# Patient Record
Sex: Female | Born: 1960
Health system: Southern US, Community
[De-identification: ages and names within clinical notes are randomized; demographics above are authoritative.]

## PROBLEM LIST (undated history)

## (undated) ENCOUNTER — Ambulatory Visit (HOSPITAL_COMMUNITY): Disposition: A | Discharge status: Home / Self Care | Payer: Self-pay

## (undated) DIAGNOSIS — U071 COVID-19: Secondary | ICD-10-CM

## (undated) DIAGNOSIS — F419 Anxiety disorder, unspecified: Secondary | ICD-10-CM

## (undated) DIAGNOSIS — M67439 Ganglion, unspecified wrist: Secondary | ICD-10-CM

## (undated) DIAGNOSIS — E785 Hyperlipidemia, unspecified: Secondary | ICD-10-CM

## (undated) DIAGNOSIS — F329 Major depressive disorder, single episode, unspecified: Secondary | ICD-10-CM

## (undated) DIAGNOSIS — F32A Depression, unspecified: Secondary | ICD-10-CM

## (undated) HISTORY — DX: COVID-19: U07.1

---

## 1997-09-15 ENCOUNTER — Other Ambulatory Visit: Admission: RE | Admit: 1997-09-15 | Discharge: 1997-09-15 | Payer: Self-pay | Admitting: Obstetrics and Gynecology

## 1997-11-19 ENCOUNTER — Other Ambulatory Visit: Admission: RE | Admit: 1997-11-19 | Discharge: 1997-11-19 | Payer: Self-pay | Admitting: Obstetrics and Gynecology

## 1998-03-26 ENCOUNTER — Other Ambulatory Visit: Admission: RE | Admit: 1998-03-26 | Discharge: 1998-03-26 | Payer: Self-pay | Admitting: *Deleted

## 1998-04-27 ENCOUNTER — Other Ambulatory Visit: Admission: RE | Admit: 1998-04-27 | Discharge: 1998-04-27 | Payer: Self-pay | Admitting: Obstetrics and Gynecology

## 1998-09-16 ENCOUNTER — Other Ambulatory Visit: Admission: RE | Admit: 1998-09-16 | Discharge: 1998-09-16 | Payer: Self-pay | Admitting: Obstetrics and Gynecology

## 1999-09-06 ENCOUNTER — Other Ambulatory Visit: Admission: RE | Admit: 1999-09-06 | Discharge: 1999-09-06 | Payer: Self-pay | Admitting: Obstetrics and Gynecology

## 1999-10-05 ENCOUNTER — Other Ambulatory Visit: Admission: RE | Admit: 1999-10-05 | Discharge: 1999-10-05 | Payer: Self-pay | Admitting: Obstetrics and Gynecology

## 2000-01-30 ENCOUNTER — Other Ambulatory Visit: Admission: RE | Admit: 2000-01-30 | Discharge: 2000-01-30 | Payer: Self-pay | Admitting: Obstetrics and Gynecology

## 2000-02-09 ENCOUNTER — Ambulatory Visit (HOSPITAL_COMMUNITY): Admission: RE | Admit: 2000-02-09 | Discharge: 2000-02-09 | Payer: Self-pay | Admitting: Obstetrics and Gynecology

## 2000-02-09 ENCOUNTER — Encounter: Payer: Self-pay | Admitting: Obstetrics and Gynecology

## 2000-02-17 ENCOUNTER — Encounter (INDEPENDENT_AMBULATORY_CARE_PROVIDER_SITE_OTHER): Payer: Self-pay

## 2000-02-17 ENCOUNTER — Other Ambulatory Visit: Admission: RE | Admit: 2000-02-17 | Discharge: 2000-02-17 | Payer: Self-pay | Admitting: Obstetrics and Gynecology

## 2000-02-17 ENCOUNTER — Ambulatory Visit (HOSPITAL_COMMUNITY): Admission: RE | Admit: 2000-02-17 | Discharge: 2000-02-17 | Payer: Self-pay | Admitting: Obstetrics and Gynecology

## 2000-03-23 ENCOUNTER — Other Ambulatory Visit: Admission: RE | Admit: 2000-03-23 | Discharge: 2000-03-23 | Payer: Self-pay | Admitting: Obstetrics and Gynecology

## 2000-06-21 ENCOUNTER — Other Ambulatory Visit: Admission: RE | Admit: 2000-06-21 | Discharge: 2000-06-21 | Payer: Self-pay | Admitting: Obstetrics and Gynecology

## 2000-11-19 ENCOUNTER — Other Ambulatory Visit: Admission: RE | Admit: 2000-11-19 | Discharge: 2000-11-19 | Payer: Self-pay | Admitting: Obstetrics and Gynecology

## 2000-11-26 ENCOUNTER — Encounter: Admission: RE | Admit: 2000-11-26 | Discharge: 2001-02-24 | Payer: Self-pay | Admitting: Obstetrics and Gynecology

## 2001-02-07 ENCOUNTER — Inpatient Hospital Stay (HOSPITAL_COMMUNITY): Admission: AD | Admit: 2001-02-07 | Discharge: 2001-02-11 | Payer: Self-pay | Admitting: Obstetrics and Gynecology

## 2001-02-07 ENCOUNTER — Encounter (INDEPENDENT_AMBULATORY_CARE_PROVIDER_SITE_OTHER): Payer: Self-pay | Admitting: Specialist

## 2001-03-14 ENCOUNTER — Other Ambulatory Visit: Admission: RE | Admit: 2001-03-14 | Discharge: 2001-03-14 | Payer: Self-pay | Admitting: Obstetrics and Gynecology

## 2003-03-05 ENCOUNTER — Other Ambulatory Visit: Admission: RE | Admit: 2003-03-05 | Discharge: 2003-03-05 | Payer: Self-pay | Admitting: Obstetrics and Gynecology

## 2003-09-16 ENCOUNTER — Other Ambulatory Visit: Admission: RE | Admit: 2003-09-16 | Discharge: 2003-09-16 | Payer: Self-pay | Admitting: Obstetrics and Gynecology

## 2003-09-24 ENCOUNTER — Ambulatory Visit (HOSPITAL_COMMUNITY): Admission: RE | Admit: 2003-09-24 | Discharge: 2003-09-24 | Payer: Self-pay | Admitting: Obstetrics and Gynecology

## 2004-03-28 ENCOUNTER — Other Ambulatory Visit: Admission: RE | Admit: 2004-03-28 | Discharge: 2004-03-28 | Payer: Self-pay | Admitting: Obstetrics and Gynecology

## 2004-03-29 ENCOUNTER — Other Ambulatory Visit: Admission: RE | Admit: 2004-03-29 | Discharge: 2004-03-29 | Payer: Self-pay | Admitting: Obstetrics and Gynecology

## 2004-08-31 ENCOUNTER — Other Ambulatory Visit: Admission: RE | Admit: 2004-08-31 | Discharge: 2004-08-31 | Payer: Self-pay | Admitting: Obstetrics and Gynecology

## 2005-02-21 ENCOUNTER — Other Ambulatory Visit: Admission: RE | Admit: 2005-02-21 | Discharge: 2005-02-21 | Payer: Self-pay | Admitting: Obstetrics and Gynecology

## 2006-10-02 ENCOUNTER — Encounter: Admission: RE | Admit: 2006-10-02 | Discharge: 2006-10-02 | Payer: Self-pay | Admitting: Family Medicine

## 2007-12-20 ENCOUNTER — Encounter: Admission: RE | Admit: 2007-12-20 | Discharge: 2007-12-20 | Payer: Self-pay | Admitting: Family Medicine

## 2008-09-22 ENCOUNTER — Encounter: Admission: RE | Admit: 2008-09-22 | Discharge: 2008-09-22 | Payer: Self-pay | Admitting: Family Medicine

## 2010-06-07 ENCOUNTER — Other Ambulatory Visit: Payer: Self-pay | Admitting: Obstetrics and Gynecology

## 2010-07-29 NOTE — Discharge Summary (Signed)
[facility]  Patient:    Kimberly David, Kimberly David Visit Number: [id number] MRN: [id number]          Service Type: OBS Location: 910A 9132 01 Attending Physician:  Miguel Aschoff Dictated by:   Caralyn Guile. Arlyce Dice, M.D. Admit Date:  02/07/2001 Discharge Date: 02/11/2001                             Discharge Summary  FINAL DIAGNOSES:              1. Term pregnancy, delivered living well                                  female infant.                               2. Chorioamnionitis.                               3. Nonreassuring fetal heart rate tracing.  SECONDARY DIAGNOSES:          None.  PROCEDURE:                    Primary low transverse cesarean section.  COMPLICATIONS:                None.  CONDITION ON DISCHARGE:       Improved.  HISTORY:                      This is a 50 year old gravida 4, para 2, AB 1, at 39-1/[redacted] weeks gestation who was admitted to the hospital with prodromal labor.  She was observed during the night and the following morning she was found to be 5-cm dilated and 70% effaced.  She was begun on Pitocin augmentation, had her membranes ruptured, and was started on an amnioinfusion, and because of a low grade temperature was given Ancef.  She progressed through the morning and then at 9:30 in the morning, she was 8 cm and at 10 a.m., she was a rim.  However, because of variable decelerations and temperature of 100.8, it was felt that to continue the trial of labor would be increased risk to the fetus and the decision was made to proceed with cesarean section.  She was taken to the operating room by Dr. Conley Simmonds where a primary low transverse cesarean section was performed with the delivery of a female infant Apgar scores 8 and 9, birth weight 6 pounds, 7 ounces and a nuchal cord was noted.  The patient did well postoperatively without significant fever or anemia and on the third postoperative day, she was felt to be ready for  discharge.  DISPOSITION:                  She was discharged on a regular diet, told to limit her activity.  She was given Tylox, 25 tablets, to take 1-2 q.4h. for pain.  She was also given Fioricet, 25 tablets, to take 1 q.6h. for recurrent headaches which she has experienced throughout the pregnancy and was continuing to experience postpartum.  She was asked to take her prenatal vitamins while she was nursing and given ferrous sulfate for iron supplementation.  She was asked to call  the office and make an appointment to return for followup in four weeks.  LABORATORY DATA:              Revealed white count on admission of 13.4 with a hemoglobin of 12.5.  Postoperatively, her white count was 13.3 with a hemoglobin of 8.9.  Her platelet count on discharge was 154,000.  Her renal and liver function tests were all within normal limits, except for an alkaline phosphatase which is normally elevated during pregnancy. Dictated by:   Caralyn Guile Arlyce Dice, M.D. Attending Physician:  Miguel Aschoff DD:  02/11/01 TD:  02/11/01 Job: [id number]

## 2010-07-29 NOTE — Op Note (Signed)
[facility]  Patient:    Kimberly David, Kimberly David Visit Number: [id number] MRN: [id number]          Service Type: OBS Location: 910A 9132 01 Attending Physician:  Miguel Aschoff Dictated by:   Devoria Albe Edward Jolly, M.D. Proc. Date: 02/08/01 Admit Date:  02/07/2001                             Operative Report  PREOPERATIVE DIAGNOSES:       1. Intrauterine gestation at 39+5 weeks.                               2. Nonreassuring fetal assessment.                               3. Pericardioamnionitis.                               4. Gestational diabetes mellitus.                               5. Gestational hypertension.  POSTOPERATIVE DIAGNOSES:      1. Intrauterine gestation at 39+3 weeks.                               2. Nonreassuring fetal assessment.                               3. Pericardioamnionitis.                               4. Gestational diabetes mellitus.                               5. Gestational hypertension.  PROCEDURE:                    Primary low segment transverse cesarean section.  SURGEON:                      Brook A. Edward Jolly, M.D.  ANESTHESIA:                   Epidural IV.  FLUIDS:                       900 cc Ringers lactate.  ESTIMATED BLOOD LOSS:         500 cc.  URINE OUTPUT:                 150 cc.  COMPLICATIONS:                None.  INDICATIONS:                  The patient was a 50 year old gravida 4, para 2, 0, 1, 2, Caucasian female who was admitted on February 07, 2001, at 39+[redacted] weeks gestation in latent phase labor.  The patient had received an epidural, and she had the spontaneous rupture of membranes with clear fluid  at 5:18 a.m. During the course of the patients labor, she did develop a temperature which was treated with Ancef intravenously.  The patient was diagnosed with inadequate labor.  She received cervical dilatation to 5.0 cm.  The fetal heart rate tracing at this time demonstrated a fetal heart rate  baseline at 140 with beat to beat variability and accelerations and moderate variable decelerations.  With an IUPC in place, an amnio infusion was performed at this time.  Pitocin augmentation of labor was begun after the variables became mild in nature.  The patient did progress throughout her active phase, with an occasional moderate to severe variable deceleration with position changes. The Pitocin was intermittently discontinued.  Off of the Pitocin medication, the patient was then able to achieve dilation to an anterior cervical flip lip which was nonreducible with a pushing attempt.  At this time, the fetal heart rate decreased to the 60s to 70s and was persistent at this rate, at which time a decision was made to proceed immediately with a primary cesarean section for a nonreassuring fetal assessment in the setting of cardioamnionitis.  The risks and benefits were reviewed with the patient, who chose to proceed.  FINDINGS:                     A viable female infant was delivered at 10:17 a.m. with Apgars of 8 at one minute, and 9 at five minutes.  The infant was noted to be vigorous at birth and cried spontaneously immediately upon delivery.  The weight was later noted to be 6 pounds 7 ounces in the nursery. A nuchal cord x 1 was noted.  The placenta had a normal insertion of a three-vessel cord.  The uterus, tubes, and ovaries were normal.  It was not possible to obtain a cord pH, as the vessels in the placenta were noted to collapse immediately upon delivery.  The placenta was manually extracted, and the cord blood was obtained, but no pH at this time.  At this time, the infant was noted to be vigorous and doing well.  SPECIMENS:                    The placenta was sent to pathology.  DESCRIPTION OF PROCEDURE:     With an IV, epidural catheter, and a Foley catheter in place, the patient was taken to the operating room after she received Bicitra orally.   The patients epidural  was dosed, and she was placed in the supine position.  The abdomen was prepped and draped, and adequate anesthesia was ensured.  A Pfannenstiel incision was created sharply with a scalpel, and the incision was carried down to the fascia using the same.  The fascia was then scored using the scalpel, and the rectus muscles were dissected off of the fascia using the Mayo scissors.  The muscles were separated bluntly, and the parietal peritoneum was grasped with a hemostat clamp and ultimately entered bluntly.  The incision was extended cranially and caudally with the Metzenbaum scissors.  A bladder flap was created with the same instrument.  A transverse incision was created sharply with a scalpel, and the incision was extended bluntly.  Membranes were ruptured at this time, and a hand was used to deliver the vertex without difficulty.  The nares and mouth were suctioned.  The infant was noted to be vigorous.  The remainder of the infant was delivered and the cord was doubly clamped and cut.  The newborn was carried over to the awaiting pediatricians.  An attempt was made to obtain a sample of blood for a cord pH at this time, but this was not possible, as noted above.  The placenta was then manually extracted, and cord blood was able to be obtained.  The uterine cavity was wiped with a moistened lap pad, and there were no remaining membranes noted. The uterine incision was closed in a double layer closure.  The first was a running lock suture of #1 chromic, and the second layer was an imbricating layer of the same.  The uterus was exteriorized at this point for visualization of the operative site along the uterus.  There was some bleeding noted along the right apex of the incision, which responded to figure-of-eight sutures of #1 chromic.  The uterus was then returned to the peritoneal cavity which was irrigated and suctioned.  The incision was noted to be hemostatic on the uterus at this  point.  The rectus muscles were brought together in the midline with one figure-of-eight suture of #1 chromic, and the fascia was  closed with a running suture of #0 Vicryl.  The subcutaneous tissue was then irrigated and suctioned, and hemostasis was created of small bleeding vessels with monopolar cautery.  Interrupted sutures of #3-0 plain were placed in the subcutaneous layer, and the skin was closed with staples.  A sterile bandage was placed over this.  The uterus was expressed of any remaining clots at this time.  The patient was escorted to the recovery room in a stable and awake condition. There were no complications to the procedure.  All needle, instrument, and sponge counts were correct.  The patient did receive cefotetan 2 g IV at cord clamp, and she received Pitocin 20 units IV after the delivery of the placenta. Dictated by:   Devoria Albe Edward Jolly, M.D. Attending Physician:  Miguel Aschoff DD:  02/08/01 TD:  02/08/01 Job: [id number]

## 2010-07-29 NOTE — Op Note (Signed)
[facility]  Patient:    Kimberly David, Kimberly David                    MRN: [id number] Proc. Date: 02/17/00 Adm. Date:  [id number] Attending:  Osborn Coho                           Operative Report  PREOPERATIVE DIAGNOSIS:       Spontaneous abortion.  POSTOPERATIVE DIAGNOSIS:      Spontaneous abortion.  OPERATION:                    Dilation and curettage.  SURGEON:                      Mark E. Dareen Piano, M.D.  ANESTHESIA:                   MAC with paracervical block.  ANTIBIOTIC:                   Ancef 1 g.  ESTIMATED BLOOD LOSS:         50 cc.  SPECIMENS:                    Products of conception sent to pathology.  COMPLICATIONS:                None.  DRAINS:                       Red rubber catheter to bladder.  DESCRIPTION OF PROCEDURE:     The patient was taken to the operating room, where she was placed in a dorsolithotomy position.  She was prepped with Hibiclens and draped with green towels. MAC anesthesia was administered. A sterile speculum was placed in the vagina; 20 cc of 1% lidocaine was used for a paracervical block.  A single-tooth tenaculum was applied to the anterior cervical lip.  The cervical os was dilated to a 28-F.  An 8 mm suction cannula was placed into the uterine cavity, and products of conception were withdrawn.  Sharp curettage was then performed, followed by repeat suction.  The patient tolerated the procedure well.  She will be discharged to home with Tylox to take p.r.n. and Keflex 500 mg q.i.d. for two days.  The patient will be RhoGAM if Rh negative. DD:  02/17/00 TD:  02/18/00 Job: [id number]

## 2012-01-05 ENCOUNTER — Other Ambulatory Visit: Payer: Self-pay | Admitting: Orthopedic Surgery

## 2012-01-09 ENCOUNTER — Encounter (HOSPITAL_BASED_OUTPATIENT_CLINIC_OR_DEPARTMENT_OTHER): Payer: Self-pay | Admitting: *Deleted

## 2012-01-11 ENCOUNTER — Ambulatory Visit (HOSPITAL_BASED_OUTPATIENT_CLINIC_OR_DEPARTMENT_OTHER)
Admission: RE | Admit: 2012-01-11 | Discharge: 2012-01-11 | Disposition: A | Discharge status: Home / Self Care | Payer: BC Managed Care – PPO | Source: Ambulatory Visit | Attending: Orthopedic Surgery | Admitting: Orthopedic Surgery

## 2012-01-11 ENCOUNTER — Encounter (HOSPITAL_BASED_OUTPATIENT_CLINIC_OR_DEPARTMENT_OTHER)
Admission: RE | Disposition: A | Discharge status: Home / Self Care | Payer: Self-pay | Source: Ambulatory Visit | Attending: Orthopedic Surgery

## 2012-01-11 ENCOUNTER — Ambulatory Visit (HOSPITAL_BASED_OUTPATIENT_CLINIC_OR_DEPARTMENT_OTHER): Payer: BC Managed Care – PPO | Admitting: Anesthesiology

## 2012-01-11 ENCOUNTER — Encounter (HOSPITAL_BASED_OUTPATIENT_CLINIC_OR_DEPARTMENT_OTHER): Payer: Self-pay | Admitting: Anesthesiology

## 2012-01-11 ENCOUNTER — Encounter (HOSPITAL_BASED_OUTPATIENT_CLINIC_OR_DEPARTMENT_OTHER): Payer: Self-pay

## 2012-01-11 DIAGNOSIS — E785 Hyperlipidemia, unspecified: Secondary | ICD-10-CM | POA: Insufficient documentation

## 2012-01-11 DIAGNOSIS — F3289 Other specified depressive episodes: Secondary | ICD-10-CM | POA: Insufficient documentation

## 2012-01-11 DIAGNOSIS — Z79899 Other long term (current) drug therapy: Secondary | ICD-10-CM | POA: Insufficient documentation

## 2012-01-11 DIAGNOSIS — M6740 Ganglion, unspecified site: Secondary | ICD-10-CM | POA: Insufficient documentation

## 2012-01-11 DIAGNOSIS — F329 Major depressive disorder, single episode, unspecified: Secondary | ICD-10-CM | POA: Insufficient documentation

## 2012-01-11 HISTORY — DX: Ganglion, unspecified wrist: M67.439

## 2012-01-11 HISTORY — DX: Anxiety disorder, unspecified: F41.9

## 2012-01-11 HISTORY — DX: Depression, unspecified: F32.A

## 2012-01-11 HISTORY — DX: Major depressive disorder, single episode, unspecified: F32.9

## 2012-01-11 HISTORY — DX: Hyperlipidemia, unspecified: E78.5

## 2012-01-11 HISTORY — PX: GANGLION CYST EXCISION: SHX1691

## 2012-01-11 SURGERY — EXCISION, GANGLION CYST, WRIST
Anesthesia: General | Site: Wrist | Laterality: Right | Wound class: Clean

## 2012-01-11 MED ORDER — HYDROMORPHONE HCL PF 1 MG/ML IJ SOLN
0.2500 mg | INTRAMUSCULAR | Status: DC | PRN
  Administered 2012-01-11: 0.5 mg via INTRAVENOUS

## 2012-01-11 MED ORDER — CHLORHEXIDINE GLUCONATE 4 % EX LIQD: 60.0000 mL | Freq: Once | CUTANEOUS | Status: DC

## 2012-01-11 MED ORDER — OXYCODONE HCL 5 MG/5ML PO SOLN: 5.0000 mg | Freq: Once | ORAL | Status: DC | PRN

## 2012-01-11 MED ORDER — FENTANYL CITRATE 0.05 MG/ML IJ SOLN
INTRAMUSCULAR | Status: DC | PRN
  Administered 2012-01-11: 100 ug via INTRAVENOUS

## 2012-01-11 MED ORDER — PROPOFOL 10 MG/ML IV BOLUS
INTRAVENOUS | Status: DC | PRN
  Administered 2012-01-11: 120 mg via INTRAVENOUS

## 2012-01-11 MED ORDER — OXYCODONE HCL 5 MG PO TABS: 5.0000 mg | ORAL_TABLET | Freq: Once | ORAL | Status: DC | PRN

## 2012-01-11 MED ORDER — EPHEDRINE SULFATE 50 MG/ML IJ SOLN
INTRAMUSCULAR | Status: DC | PRN
  Administered 2012-01-11: 10 mg via INTRAVENOUS

## 2012-01-11 MED ORDER — HYDROCODONE-ACETAMINOPHEN 5-325 MG PO TABS: ORAL_TABLET | ORAL | Status: DC

## 2012-01-11 MED ORDER — CEFAZOLIN SODIUM-DEXTROSE 2-3 GM-% IV SOLR
INTRAVENOUS | Status: DC | PRN
  Administered 2012-01-11: 2 g via INTRAVENOUS

## 2012-01-11 MED ORDER — LACTATED RINGERS IV SOLN
INTRAVENOUS | Status: DC
  Administered 2012-01-11 (×2): via INTRAVENOUS

## 2012-01-11 MED ORDER — ONDANSETRON HCL 4 MG/2ML IJ SOLN: 4.0000 mg | Freq: Once | INTRAMUSCULAR | Status: DC | PRN

## 2012-01-11 MED ORDER — MEPERIDINE HCL 25 MG/ML IJ SOLN: 6.2500 mg | INTRAMUSCULAR | Status: DC | PRN

## 2012-01-11 MED ORDER — LIDOCAINE HCL (CARDIAC) 20 MG/ML IV SOLN
INTRAVENOUS | Status: DC | PRN
  Administered 2012-01-11: 60 mg via INTRAVENOUS

## 2012-01-11 MED ORDER — MIDAZOLAM HCL 5 MG/5ML IJ SOLN
INTRAMUSCULAR | Status: DC | PRN
  Administered 2012-01-11: 2 mg via INTRAVENOUS

## 2012-01-11 MED ORDER — BUPIVACAINE HCL (PF) 0.25 % IJ SOLN
INTRAMUSCULAR | Status: DC | PRN
  Administered 2012-01-11: 5 mL

## 2012-01-11 SURGICAL SUPPLY — 44 items
BANDAGE ELASTIC 3 VELCRO ST LF (GAUZE/BANDAGES/DRESSINGS) ×2 IMPLANT
BANDAGE GAUZE ELAST BULKY 4 IN (GAUZE/BANDAGES/DRESSINGS) ×2 IMPLANT
BLADE MINI RND TIP GREEN BEAV (BLADE) IMPLANT
BLADE SURG 15 STRL LF DISP TIS (BLADE) ×2 IMPLANT
BLADE SURG 15 STRL SS (BLADE) ×4
BNDG CMPR 9X4 STRL LF SNTH (GAUZE/BANDAGES/DRESSINGS) ×1
BNDG CMPR MD 5X2 ELC HKLP STRL (GAUZE/BANDAGES/DRESSINGS)
BNDG ELASTIC 2 VLCR STRL LF (GAUZE/BANDAGES/DRESSINGS) IMPLANT
BNDG ESMARK 4X9 LF (GAUZE/BANDAGES/DRESSINGS) ×1 IMPLANT
CHLORAPREP W/TINT 26ML (MISCELLANEOUS) ×3 IMPLANT
CLOTH BEACON ORANGE TIMEOUT ST (SAFETY) ×2 IMPLANT
CORDS BIPOLAR (ELECTRODE) ×2 IMPLANT
COVER MAYO STAND STRL (DRAPES) ×2 IMPLANT
COVER TABLE BACK 60X90 (DRAPES) ×2 IMPLANT
CUFF TOURNIQUET SINGLE 18IN (TOURNIQUET CUFF) ×2 IMPLANT
DRAPE EXTREMITY T 121X128X90 (DRAPE) ×2 IMPLANT
DRAPE SURG 17X23 STRL (DRAPES) ×2 IMPLANT
DRSG PAD ABDOMINAL 8X10 ST (GAUZE/BANDAGES/DRESSINGS) ×1 IMPLANT
GAUZE XEROFORM 1X8 LF (GAUZE/BANDAGES/DRESSINGS) ×2 IMPLANT
GLOVE BIO SURGEON STRL SZ7.5 (GLOVE) ×2 IMPLANT
GLOVE BIOGEL M STRL SZ7.5 (GLOVE) ×1 IMPLANT
GOWN PREVENTION PLUS XLARGE (GOWN DISPOSABLE) ×1 IMPLANT
GOWN PREVENTION PLUS XXLARGE (GOWN DISPOSABLE) ×3 IMPLANT
NDL HYPO 25X1 1.5 SAFETY (NEEDLE) IMPLANT
NEEDLE HYPO 25X1 1.5 SAFETY (NEEDLE) ×2 IMPLANT
NS IRRIG 1000ML POUR BTL (IV SOLUTION) ×2 IMPLANT
PACK BASIN DAY SURGERY FS (CUSTOM PROCEDURE TRAY) ×2 IMPLANT
PADDING CAST ABS 4INX4YD NS (CAST SUPPLIES) ×1
PADDING CAST ABS COTTON 4X4 ST (CAST SUPPLIES) ×1 IMPLANT
SPLINT UNIVERSAL RIGHT (SOFTGOODS) IMPLANT
SPLINT WRIST 10 LEFT UNV (SOFTGOODS) IMPLANT
SPONGE GAUZE 4X4 12PLY (GAUZE/BANDAGES/DRESSINGS) ×2 IMPLANT
STOCKINETTE 4X48 STRL (DRAPES) ×2 IMPLANT
STRIP CLOSURE SKIN 1/2X4 (GAUZE/BANDAGES/DRESSINGS) ×1 IMPLANT
SUT ETHILON 4 0 PS 2 18 (SUTURE) ×2 IMPLANT
SUT MNCRL AB 4-0 PS2 18 (SUTURE) ×1 IMPLANT
SUT VIC AB 4-0 P-3 18XBRD (SUTURE) IMPLANT
SUT VIC AB 4-0 P3 18 (SUTURE) ×2
SUT VICRYL 4-0 PS2 18IN ABS (SUTURE) IMPLANT
SYR BULB 3OZ (MISCELLANEOUS) ×2 IMPLANT
SYR CONTROL 10ML LL (SYRINGE) ×1 IMPLANT
TOWEL OR 17X24 6PK STRL BLUE (TOWEL DISPOSABLE) ×3 IMPLANT
UNDERPAD 30X30 INCONTINENT (UNDERPADS AND DIAPERS) ×2 IMPLANT
WATER STERILE IRR 1000ML POUR (IV SOLUTION) ×1 IMPLANT

## 2012-01-11 NOTE — Anesthesia Postprocedure Evaluation (Signed)
Anesthesia Post Note  Patient: Kimberly David  Procedure(s) Performed: Procedure(s) (LRB): REMOVAL GANGLION OF WRIST (Right)  Anesthesia type: general  Patient location: PACU  Post pain: Pain level controlled  Post assessment: Patient's Cardiovascular Status Stable  Last Vitals:  Filed Vitals:   01/11/12 1245  BP: 133/71  Pulse: 118  Temp:   Resp: 16    Post vital signs: Reviewed and stable  Level of consciousness: sedated  Complications: No apparent anesthesia complications  

## 2012-01-11 NOTE — Op Note (Signed)
Dictation [id number]

## 2012-01-11 NOTE — Anesthesia Preprocedure Evaluation (Signed)

## 2012-01-11 NOTE — Transfer of Care (Signed)
Immediate Anesthesia Transfer of Care Note  Patient: Kimberly David  Procedure(s) Performed: Procedure(s) (LRB) with comments: REMOVAL GANGLION OF WRIST (Right) - right wrist excision mass   Patient Location: PACU  Anesthesia Type:General  Level of Consciousness: sedated  Airway & Oxygen Therapy: Patient Spontanous Breathing and Patient connected to face mask oxygen  Post-op Assessment: Report given to PACU RN and Post -op Vital signs reviewed and stable  Post vital signs: Reviewed and stable  Complications: No apparent anesthesia complications

## 2012-01-11 NOTE — Anesthesia Postprocedure Evaluation (Signed)
Anesthesia Post Note  Patient: Kimberly David  Procedure(s) Performed: Procedure(s) (LRB): REMOVAL GANGLION OF WRIST (Right)  Anesthesia type: general  Patient location: PACU  Post pain: Pain level controlled  Post assessment: Patient's Cardiovascular Status Stable  Last Vitals:  Filed Vitals:   01/11/12 1245  BP: 133/71  Pulse: 118  Temp:   Resp: 16    Post vital signs: Reviewed and stable  Level of consciousness: sedated  Complications: No apparent anesthesia complications

## 2012-01-11 NOTE — Anesthesia Procedure Notes (Signed)
Procedure Name: LMA Insertion Date/Time: 01/11/2012 12:06 PM Performed by: Burna Cash Pre-anesthesia Checklist: Patient identified, Emergency Drugs available, Suction available and Patient being monitored Patient Re-evaluated:Patient Re-evaluated prior to inductionOxygen Delivery Method: Circle System Utilized Preoxygenation: Pre-oxygenation with 100% oxygen Intubation Type: IV induction Ventilation: Mask ventilation without difficulty LMA: LMA inserted LMA Size: 4.0 Number of attempts: 1 Airway Equipment and Method: bite block Placement Confirmation: positive ETCO2 Tube secured with: Tape Dental Injury: Teeth and Oropharynx as per pre-operative assessment

## 2012-01-11 NOTE — H&P (Signed)
  Kimberly David is an 51 y.o. female.   Chief Complaint: right wrist ganglion HPI: 51 yo rhd female with 9 months history of mass on dorsum of right wrist.  Painful when bumped.  No injuries.  No fevers, chills, night sweats.  Past Medical History  Diagnosis Date  . Anxiety   . Depression   . Hyperlipidemia     takes lipitor  . Ganglion cyst of wrist     Rt    History reviewed. No pertinent past surgical history.  History reviewed. No pertinent family history. Social History:  reports that she has never smoked. She has never used smokeless tobacco. She reports that she does not drink alcohol or use illicit drugs.  Allergies: No Known Allergies  Medications Prior to Admission  Medication Sig Dispense Refill  . ALPRAZolam (XANAX) 0.25 MG tablet Take 0.25 mg by mouth at bedtime as needed.      Marland Kitchen atorvastatin (LIPITOR) 20 MG tablet Take 20 mg by mouth daily.      . cholecalciferol (VITAMIN D) 1000 UNITS tablet Take 1,000 Units by mouth daily.      Marland Kitchen FLUoxetine (PROZAC) 40 MG capsule Take 40 mg by mouth daily.      . norethindrone-ethinyl estradiol (FEMHRT 1/5) 1-5 MG-MCG TABS Take by mouth daily.        Results for orders placed during the hospital encounter of 01/11/12 (from the past 48 hour(s))  POCT HEMOGLOBIN-HEMACUE     Status: Normal   Collection Time   01/11/12 10:50 AM      Component Value Range Comment   Hemoglobin 12.5  12.0 - 15.0 g/dL     No results found.   A comprehensive review of systems was negative except for: Eyes: positive for contacts/glasses Neurological: positive for headaches Behavioral/Psych: positive for depression  Blood pressure 128/75, pulse 99, temperature 98.7 F (37.1 C), temperature source Oral, resp. rate 20, height 5' 1.5" (1.562 m), weight 52.164 kg (115 lb), SpO2 97.00%.  General appearance: alert, cooperative and appears stated age Head: Normocephalic, without obvious abnormality, atraumatic Neck: supple, symmetrical, trachea  midline Resp: clear to auscultation bilaterally Cardio: regular rate and rhythm GI: non tender Extremities: intact sensation and capillary refill all digits.  +epl/fpl/io.  mass on dorsum of right wrist.  no erythema or ecchymosis.  skin intact.  transilluminates. Pulses: 2+ and symmetric Skin: Skin color, texture, turgor normal. No rashes or lesions Neurologic: Grossly normal Incision/Wound: na  Assessment/Plan Right wrist dorsal ganglion cyst.  Non operative and operative treatment options were discussed with the patient and patient wishes to proceed with operative treatment. Risks, benefits, and alternatives of surgery were discussed and the patient agrees with the plan of care.   Kimberly David R 01/11/2012, 11:38 AM

## 2012-01-12 ENCOUNTER — Encounter (HOSPITAL_BASED_OUTPATIENT_CLINIC_OR_DEPARTMENT_OTHER): Payer: Self-pay | Admitting: Orthopedic Surgery

## 2012-01-12 NOTE — Op Note (Signed)
NAMENOHELANI, PECINA             ACCOUNT NO.:  [id number]  MEDICAL RECORD NO.:  [id number]  LOCATION:                                 FACILITY:  PHYSICIAN:  Betha Loa, MD             DATE OF BIRTH:  DATE OF PROCEDURE:  01/11/2012 DATE OF DISCHARGE:                              OPERATIVE REPORT   PREOPERATIVE DIAGNOSIS:  Right dorsal wrist ganglion cyst.  POSTOPERATIVE DIAGNOSIS:  Right dorsal wrist ganglion cyst.  PROCEDURE:  Excision of mass, right dorsal wrist.  SURGEON:  Betha Loa, MD  ASSISTANT:  None.  ANESTHESIA:  General.  IV FLUIDS:  Per anesthesia flow sheet.  ESTIMATED BLOOD LOSS:  Minimal.  COMPLICATIONS:  None.  SPECIMENS:  Right wrist dorsal ganglion to Pathology.  DISPOSITION:  Stable to PACU.  INDICATIONS:  Ms. Kimberly David is a 51 year old female who has noted 9 months of a mass on the dorsum of her right wrist.  It is painful.  On evaluation in the office, the cyst was firm, ballotable and mobile under the skin, strongly transilluminated.  Discussed with Ms. Bhullar the nature of ganglion cyst.  We discussed treatment options including nonoperative and operative treatments.  She wished to have it excised. Risks, benefits, and alternatives of surgery were discussed including risk of blood loss, infection, damage to nerves, vessels, tendons, ligaments, bone; failure of surgery; need for additional surgery, complications with wound healing, continued pain, and recurrence of mass.  She voiced understanding of these risks and elected to proceed.  OPERATIVE COURSE:  After being identified preoperatively by myself, the patient and I agreed upon procedure and site procedure.  Surgical site was marked.  Risks, benefits, and alternatives of surgery were reviewed and she wished to proceed.  Surgical consent had been signed.  She was given 2 g of IV Ancef as preoperative antibiotic prophylaxis.  She was transferred up to operating room and placed on the  operating room table in supine position with the right upper extremity on arm board.  General anesthesia was induced by the anesthesiologist.  The right upper extremity was prepped and draped in normal sterile orthopedic fashion. Surgical pause was performed between surgeons, anesthesia, operating room staff, and all were in agreement as to the patient, procedure, and site of procedure.  Tourniquet at the proximal aspect of the extremities inflated to 250 mmHg after exsanguination of the limb with Esmarch bandage.  A transverse incision was made over the mass at the dorsal radial side of the wrist.  It was carried into subcutaneous tissues by spreading technique.  All neurovascular structures were protected throughout the case.  The cyst was easily identified.  It was released of its surrounding adhesions.  It appeared to be coursing from the scapholunate area of the dorsal wrist capsule.  The stalk was traced and the cyst excised in its entirety.  It was sent to Pathology for examination.  The dorsal capsular tissue was not strong enough to hold the suture.  It was treated with the rongeurs and bipolar electrocautery.  The wound was copiously irrigated with sterile saline. The wound was then closed with interrupted 4-0 Vicryl suture in the  subcutaneous tissues and the skin was closed with 4-0 Monocryl in a running subcuticular fashion.  This was augmented with Steri-Strips. The wound was injected with 5 mL of 0.25% plain Marcaine to aid in postoperative analgesia.  The wound was then dressed with sterile 4x4s. An ABD was placed as a soft splint.  This was wrapped with Kerlix and Ace bandage.  Tourniquet was deflated at 90 minutes.  Fingertips were pink with brisk capillary refill after deflation of tourniquet. Operative drapes were broken down.  The patient was awoken from anesthesia safely.  She was transferred back to stretcher and taken to PACU in stable condition.  I will see her  back in the office in 1 week for postoperative followup.  I will give her Norco 5/325 1-2 p.o. q.6 h. p.r.n. pain dispensed #30.     Betha Loa, MD     KK/MEDQ  D:  01/11/2012  T:  01/12/2012  Job:  [id number]

## 2012-01-16 ENCOUNTER — Encounter (HOSPITAL_BASED_OUTPATIENT_CLINIC_OR_DEPARTMENT_OTHER): Payer: Self-pay

## 2012-10-31 ENCOUNTER — Other Ambulatory Visit: Payer: Self-pay | Admitting: Obstetrics and Gynecology

## 2013-11-18 ENCOUNTER — Other Ambulatory Visit: Payer: Self-pay | Admitting: Obstetrics and Gynecology

## 2013-11-20 ENCOUNTER — Other Ambulatory Visit: Payer: Self-pay | Admitting: Obstetrics and Gynecology

## 2013-11-20 DIAGNOSIS — R928 Other abnormal and inconclusive findings on diagnostic imaging of breast: Secondary | ICD-10-CM

## 2013-11-20 LAB — CYTOLOGY - PAP

## 2015-02-11 ENCOUNTER — Other Ambulatory Visit: Payer: Self-pay | Admitting: Obstetrics and Gynecology

## 2015-02-12 LAB — CYTOLOGY - PAP

## 2015-11-16 ENCOUNTER — Encounter (INDEPENDENT_AMBULATORY_CARE_PROVIDER_SITE_OTHER): Payer: Self-pay

## 2016-03-09 ENCOUNTER — Other Ambulatory Visit: Payer: Self-pay | Admitting: Obstetrics and Gynecology

## 2016-03-10 LAB — CYTOLOGY - PAP

## 2017-03-15 DIAGNOSIS — F331 Major depressive disorder, recurrent, moderate: Secondary | ICD-10-CM | POA: Diagnosis not present

## 2017-03-15 DIAGNOSIS — F0781 Postconcussional syndrome: Secondary | ICD-10-CM | POA: Diagnosis not present

## 2017-03-15 DIAGNOSIS — E782 Mixed hyperlipidemia: Secondary | ICD-10-CM | POA: Diagnosis not present

## 2017-05-21 DIAGNOSIS — H10023 Other mucopurulent conjunctivitis, bilateral: Secondary | ICD-10-CM | POA: Diagnosis not present

## 2017-05-21 DIAGNOSIS — J018 Other acute sinusitis: Secondary | ICD-10-CM | POA: Diagnosis not present

## 2017-11-06 DIAGNOSIS — E782 Mixed hyperlipidemia: Secondary | ICD-10-CM | POA: Diagnosis not present

## 2017-11-06 DIAGNOSIS — Z1211 Encounter for screening for malignant neoplasm of colon: Secondary | ICD-10-CM | POA: Diagnosis not present

## 2017-11-06 DIAGNOSIS — F331 Major depressive disorder, recurrent, moderate: Secondary | ICD-10-CM | POA: Diagnosis not present

## 2018-05-27 ENCOUNTER — Other Ambulatory Visit: Payer: Self-pay

## 2018-05-31 DIAGNOSIS — F331 Major depressive disorder, recurrent, moderate: Secondary | ICD-10-CM | POA: Diagnosis not present

## 2018-05-31 DIAGNOSIS — G4709 Other insomnia: Secondary | ICD-10-CM | POA: Diagnosis not present

## 2018-05-31 DIAGNOSIS — E782 Mixed hyperlipidemia: Secondary | ICD-10-CM | POA: Diagnosis not present

## 2018-09-05 DIAGNOSIS — Z01419 Encounter for gynecological examination (general) (routine) without abnormal findings: Secondary | ICD-10-CM | POA: Diagnosis not present

## 2018-09-05 DIAGNOSIS — Z6822 Body mass index (BMI) 22.0-22.9, adult: Secondary | ICD-10-CM | POA: Diagnosis not present

## 2018-09-05 DIAGNOSIS — Z124 Encounter for screening for malignant neoplasm of cervix: Secondary | ICD-10-CM | POA: Diagnosis not present

## 2018-09-05 DIAGNOSIS — R87612 Low grade squamous intraepithelial lesion on cytologic smear of cervix (LGSIL): Secondary | ICD-10-CM | POA: Diagnosis not present

## 2018-09-06 ENCOUNTER — Other Ambulatory Visit: Payer: Self-pay | Admitting: Obstetrics and Gynecology

## 2018-09-06 DIAGNOSIS — N63 Unspecified lump in unspecified breast: Secondary | ICD-10-CM

## 2018-09-16 ENCOUNTER — Ambulatory Visit
Admission: RE | Admit: 2018-09-16 | Discharge: 2018-09-16 | Disposition: A | Discharge status: Home / Self Care | Payer: BLUE CROSS/BLUE SHIELD | Source: Ambulatory Visit | Attending: Obstetrics and Gynecology | Admitting: Obstetrics and Gynecology

## 2018-09-16 ENCOUNTER — Other Ambulatory Visit: Payer: Self-pay

## 2018-09-16 DIAGNOSIS — N63 Unspecified lump in unspecified breast: Secondary | ICD-10-CM

## 2018-09-16 DIAGNOSIS — N6489 Other specified disorders of breast: Secondary | ICD-10-CM | POA: Diagnosis not present

## 2018-09-16 DIAGNOSIS — R928 Other abnormal and inconclusive findings on diagnostic imaging of breast: Secondary | ICD-10-CM | POA: Diagnosis not present

## 2018-09-26 DIAGNOSIS — N72 Inflammatory disease of cervix uteri: Secondary | ICD-10-CM | POA: Diagnosis not present

## 2018-09-26 DIAGNOSIS — R87612 Low grade squamous intraepithelial lesion on cytologic smear of cervix (LGSIL): Secondary | ICD-10-CM | POA: Diagnosis not present

## 2018-09-26 DIAGNOSIS — Z6822 Body mass index (BMI) 22.0-22.9, adult: Secondary | ICD-10-CM | POA: Diagnosis not present

## 2018-09-30 DIAGNOSIS — M545 Low back pain: Secondary | ICD-10-CM | POA: Diagnosis not present

## 2018-10-17 DIAGNOSIS — Z6822 Body mass index (BMI) 22.0-22.9, adult: Secondary | ICD-10-CM | POA: Diagnosis not present

## 2018-10-17 DIAGNOSIS — R87612 Low grade squamous intraepithelial lesion on cytologic smear of cervix (LGSIL): Secondary | ICD-10-CM | POA: Diagnosis not present

## 2019-02-24 DIAGNOSIS — G44309 Post-traumatic headache, unspecified, not intractable: Secondary | ICD-10-CM | POA: Diagnosis not present

## 2019-02-24 DIAGNOSIS — F331 Major depressive disorder, recurrent, moderate: Secondary | ICD-10-CM | POA: Diagnosis not present

## 2019-02-24 DIAGNOSIS — E782 Mixed hyperlipidemia: Secondary | ICD-10-CM | POA: Diagnosis not present

## 2019-02-28 DIAGNOSIS — J3489 Other specified disorders of nose and nasal sinuses: Secondary | ICD-10-CM | POA: Diagnosis not present

## 2019-02-28 DIAGNOSIS — Z20828 Contact with and (suspected) exposure to other viral communicable diseases: Secondary | ICD-10-CM | POA: Diagnosis not present

## 2019-03-02 DIAGNOSIS — Z8616 Personal history of COVID-19: Secondary | ICD-10-CM | POA: Insufficient documentation

## 2019-03-02 HISTORY — DX: Personal history of COVID-19: Z86.16

## 2019-03-05 DIAGNOSIS — R05 Cough: Secondary | ICD-10-CM | POA: Diagnosis not present

## 2019-03-05 DIAGNOSIS — R6883 Chills (without fever): Secondary | ICD-10-CM | POA: Diagnosis not present

## 2019-03-05 DIAGNOSIS — R519 Headache, unspecified: Secondary | ICD-10-CM | POA: Diagnosis not present

## 2019-03-05 DIAGNOSIS — Z20828 Contact with and (suspected) exposure to other viral communicable diseases: Secondary | ICD-10-CM | POA: Diagnosis not present

## 2019-05-06 ENCOUNTER — Ambulatory Visit (INDEPENDENT_AMBULATORY_CARE_PROVIDER_SITE_OTHER): Payer: BC Managed Care – PPO | Admitting: Nurse Practitioner

## 2019-05-06 ENCOUNTER — Encounter: Payer: Self-pay | Admitting: Nurse Practitioner

## 2019-05-06 ENCOUNTER — Other Ambulatory Visit: Payer: Self-pay

## 2019-05-06 VITALS — BP 138/88 | HR 124 | Temp 97.6°F | Ht 63.0 in | Wt 120.0 lb

## 2019-05-06 DIAGNOSIS — F321 Major depressive disorder, single episode, moderate: Secondary | ICD-10-CM

## 2019-05-06 DIAGNOSIS — F331 Major depressive disorder, recurrent, moderate: Secondary | ICD-10-CM

## 2019-05-06 DIAGNOSIS — F419 Anxiety disorder, unspecified: Secondary | ICD-10-CM | POA: Diagnosis not present

## 2019-05-06 DIAGNOSIS — E782 Mixed hyperlipidemia: Secondary | ICD-10-CM

## 2019-05-06 DIAGNOSIS — R002 Palpitations: Secondary | ICD-10-CM

## 2019-05-06 HISTORY — DX: Major depressive disorder, single episode, moderate: F32.1

## 2019-05-06 HISTORY — DX: Palpitations: R00.2

## 2019-05-06 HISTORY — DX: Mixed hyperlipidemia: E78.2

## 2019-05-06 MED ORDER — ALPRAZOLAM 0.25 MG PO TABS: 0.2500 mg | ORAL_TABLET | Freq: Every evening | ORAL | 0 refills | Status: DC | PRN

## 2019-05-06 MED ORDER — VENLAFAXINE HCL ER 75 MG PO CP24: 75.0000 mg | ORAL_CAPSULE | Freq: Every day | ORAL | 0 refills | Status: DC

## 2019-05-06 NOTE — Assessment & Plan Note (Addendum)
EKG completed, results shows tachycardia.  referral to cardiology. Pt knows to call or go to  ED for increase heart rate and worsening symptoms.

## 2019-05-06 NOTE — Progress Notes (Addendum)
Established Patient Office Visit  Subjective:  Patient ID: Kimberly David, female    DOB: 01/31/1961  Age: 59 y.o. MRN: [id number]  CC:  Patient  is a 59 year old female,  She is here for  f/u . The patient's medications were reviewed and reconciled since the patient's last visit.  History details were provided by the patient. The history appears to be reliable.   Chief Complaint  Patient presents with  . Follow-up    fasting  . Hyperlipidemia      HPI Kimberly David presents for Mixed hyperlipidemia  Pt presents with hyperlipidemia. Patient was diagnosed in 2003. Compliance with treatment has been good; The patient is compliant with medications, maintains a low cholesterol diet , follows up as directed , and maintains an exercise regimen . The patient denies experiencing any hypercholesterolemia related symptoms.   Depression: Patient complains of depression. She complains of worsening depression. Onset was approximately few years ago. Clinical course has been unchanged since that time.  She denies current suicidal and homicidal plan or intent.  Previous treatment includes viibryd 40 mg daily and.She complains of the following side effects from the treatment: insomnia, worsening depression.    Past Medical History:  Diagnosis Date  . Anxiety   . COVID-19   . Depression   . Ganglion cyst of wrist    Rt  . Hyperlipidemia    takes lipitor    Past Surgical History:  Procedure Laterality Date  . CESAREAN SECTION  02/08/2001  . GANGLION CYST EXCISION  01/11/2012   Procedure: REMOVAL GANGLION OF WRIST;  Surgeon: Tennis Must, MD;  Location: [facility];  Service: Orthopedics;  Laterality: Right;  right wrist excision mass     Family History  Problem Relation Age of Onset  . Hyperlipidemia Mother   . Heart attack Mother   . Migraines Mother   . Lung cancer Father   . Diabetes type II Sister   . Migraines Sister   . Diabetes type I Daughter       Social History   Socioeconomic History  . Marital status: Legally Separated    Spouse name: Not on file  . Number of children: 3  . Years of education: Not on file  . Highest education level: Not on file  Occupational History  . Not on file  Tobacco Use  . Smoking status: Never Smoker  . Smokeless tobacco: Never Used  Substance and Sexual Activity  . Alcohol use: No  . Drug use: No  . Sexual activity: Yes    Birth control/protection: Post-menopausal  Other Topics Concern  . Not on file  Social History Narrative  . Not on file   Social Determinants of Health   Financial Resource Strain:   . Difficulty of Paying Living Expenses: Not on file  Food Insecurity:   . Worried About Charity fundraiser in the Last Year: Not on file  . Ran Out of Food in the Last Year: Not on file  Transportation Needs:   . Lack of Transportation (Medical): Not on file  . Lack of Transportation (Non-Medical): Not on file  Physical Activity:   . Days of Exercise per Week: Not on file  . Minutes of Exercise per Session: Not on file  Stress:   . Feeling of Stress : Not on file  Social Connections:   . Frequency of Communication with Friends and Family: Not on file  . Frequency of Social Gatherings with Friends  and Family: Not on file  . Attends Religious Services: Not on file  . Active Member of Clubs or Organizations: Not on file  . Attends Archivist Meetings: Not on file  . Marital Status: Not on file  Intimate Partner Violence:   . Fear of Current or Ex-Partner: Not on file  . Emotionally Abused: Not on file  . Physically Abused: Not on file  . Sexually Abused: Not on file    Outpatient Medications Prior to Visit  Medication Sig Dispense Refill  . albuterol (PROAIR HFA) 108 (90 Base) MCG/ACT inhaler ProAir HFA 90 mcg/actuation aerosol inhaler    . amitriptyline (ELAVIL) 50 MG tablet Take 50 mg by mouth at bedtime.    Marland Kitchen atorvastatin (LIPITOR) 20 MG tablet Take 20 mg by  mouth daily.    . cyclobenzaprine (FLEXERIL) 10 MG tablet Take 10 mg by mouth at bedtime.    . meclizine (ANTIVERT) 25 MG tablet meclizine 25 mg tablet    . meloxicam (MOBIC) 7.5 MG tablet Take 7.5 mg by mouth daily.    . norethindrone-ethinyl estradiol (FEMHRT 1/5) 1-5 MG-MCG TABS Take by mouth daily.    Marland Kitchen tolterodine (DETROL LA) 2 MG 24 hr capsule Take 2 mg by mouth daily.    Marland Kitchen ALPRAZolam (XANAX) 0.25 MG tablet Take 0.25 mg by mouth at bedtime as needed.    . Vilazodone HCl (VIIBRYD) 40 MG TABS Take 40 mg by mouth daily.     No facility-administered medications prior to visit.    Allergies  Allergen Reactions  . Codeine Nausea And Vomiting    ROS Review of Systems  Constitutional: Negative for activity change, appetite change, fatigue and fever.  HENT: Negative for congestion, ear pain, sinus pressure and sinus pain.   Eyes: Negative for pain, discharge and redness.  Respiratory: Positive for shortness of breath. Negative for cough and chest tightness.   Cardiovascular: Negative for chest pain, palpitations and leg swelling.  Gastrointestinal: Negative for abdominal distention and abdominal pain.  Genitourinary: Negative for difficulty urinating.  Musculoskeletal: Negative for arthralgias, back pain and neck pain.  Skin: Positive for rash.  Neurological: Positive for dizziness and weakness.  Psychiatric/Behavioral: Negative for agitation and behavioral problems. The patient is nervous/anxious.       Objective:    Physical Exam  Constitutional: She appears well-developed and well-nourished.  HENT:  Right Ear: External ear normal.  Left Ear: External ear normal.  Eyes: Pupils are equal, round, and reactive to light. EOM are normal. Right eye exhibits no discharge. Left eye exhibits no discharge.  Cardiovascular:  Abnormal heart rate: Tarchycardia.  Pulmonary/Chest: Effort normal. No respiratory distress. She exhibits no tenderness.  Abdominal: Soft. Bowel sounds are  normal. She exhibits no distension. There is no abdominal tenderness.  Musculoskeletal:     Cervical back: Normal range of motion and neck supple.  Neurological: She is alert.  Skin: No rash noted.  Psychiatric: Judgment normal.    BP 138/88 (BP Location: Left Arm, Patient Position: Sitting)   Pulse (!) 124   Temp 97.6 F (36.4 C) (Temporal)   Ht '5\' 3"'$  (1.6 m)   Wt 120 lb (54.4 kg)   SpO2 99%   BMI 21.26 kg/m  Wt Readings from Last 3 Encounters:  05/06/19 120 lb (54.4 kg)  01/11/12 115 lb (52.2 kg)     Health Maintenance Due  Topic Date Due  . Hepatitis C Screening  [birth year]  . HIV Screening  11/09/1974  . TETANUS/TDAP  11/09/1978  .  COLONOSCOPY  11/08/2009  . PAP SMEAR-Modifier  03/10/2019    There are no preventive care reminders to display for this patient.  No results found for: TSH Lab Results  Component Value Date   HGB 12.5 01/11/2012   No results found for: NA, K, CHLORIDE, CO2, GLUCOSE, BUN, CREATININE, BILITOT, ALKPHOS, AST, ALT, PROT, ALBUMIN, CALCIUM, ANIONGAP, EGFR, GFR No results found for: CHOL No results found for: HDL No results found for: LDLCALC No results found for: TRIG No results found for: CHOLHDL No results found for: HGBA1C    Assessment & Plan:  Mixed hyperlipidemia Patient is well managed on current medication no changes necessary.  Palpitation EKG completed, results shows tachycardia.  referral to cardiology. Pt knows to call or go to  ED for increase heart rate and worsening symptoms.  Depression, major, single episode, moderate ([place]) Depression not well controlled, medication changed, education provided to patient  With teach back, Pt verbalize understanding. Written instructions available to patients for pick up. Rx sent to pharmacy.  Anxiety No changes to current medication, Rx sent to pharmacy.  Problem List Items Addressed This Visit    None    Visit Diagnoses    Mixed hyperlipidemia    -  Primary   Relevant Orders     CBC with Differential/Platelet   Comprehensive metabolic panel   Lipid Panel   Palpitation       Relevant Orders   EKG 12-Lead   Ambulatory referral to Cardiology   Depression, major, single episode, moderate ([facility])       Relevant Medications   venlafaxine XR (EFFEXOR XR) 75 MG 24 hr capsule   ALPRAZolam (XANAX) 0.25 MG tablet   Anxiety       Relevant Medications   venlafaxine XR (EFFEXOR XR) 75 MG 24 hr capsule   ALPRAZolam (XANAX) 0.25 MG tablet      Meds ordered this encounter  Medications  . venlafaxine XR (EFFEXOR XR) 75 MG 24 hr capsule    Sig: Take 1 capsule (75 mg total) by mouth daily with breakfast.    Dispense:  30 capsule    Refill:  0    Order Specific Question:   Supervising Provider    AnswerShelton Silvas  . DISCONTD: ALPRAZolam (XANAX) 0.25 MG tablet    Sig: Take 1 tablet (0.25 mg total) by mouth at bedtime as needed.    Dispense:  15 tablet    Refill:  0    Order Specific Question:   Supervising Provider    AnswerRochel Brome [id number]  . ALPRAZolam (XANAX) 0.25 MG tablet    Sig: Take 1 tablet (0.25 mg total) by mouth at bedtime as needed.    Dispense:  15 tablet    Refill:  0    Order Specific Question:   Supervising Provider    AnswerShelton Silvas    Follow-up: Return in about 6 months (around 11/03/2019).    Ivy Lynn, NP

## 2019-05-06 NOTE — Assessment & Plan Note (Signed)
Patient is well managed on current medication no changes necessary.

## 2019-05-06 NOTE — Assessment & Plan Note (Signed)
No changes to current medication, Rx sent to pharmacy.

## 2019-05-06 NOTE — Patient Instructions (Addendum)
Pt well managed on current therapy, increase heart rate, light headed, SOB, dizzy,  post covid-19 infection in  December 2020. EKG completed, referral to cardiology.   Palpitations Palpitations are feelings that your heartbeat is not normal. Your heartbeat may feel like it is:  Uneven.  Faster than normal.  Fluttering.  Skipping a beat. This is usually not a serious problem. In some cases, you may need tests to rule out any serious problems. Follow these instructions at home: Pay attention to any changes in your condition. Take these actions to help manage your symptoms: Eating and drinking  Avoid: ? Coffee, tea, soft drinks, and energy drinks. ? Chocolate. ? Alcohol. ? Diet pills. Lifestyle   Try to lower your stress. These things can help you relax: ? Yoga. ? Deep breathing and meditation. ? Exercise. ? Using words and images to create positive thoughts (guided imagery). ? Using your mind to control things in your body (biofeedback).  Do not use drugs.  Get plenty of rest and sleep. Keep a regular bed time. General instructions   Take over-the-counter and prescription medicines only as told by your doctor.  Do not use any products that contain nicotine or tobacco, such as cigarettes and e-cigarettes. If you need help quitting, ask your doctor.  Keep all follow-up visits as told by your doctor. This is important. You may need more tests if palpitations do not go away or get worse. Contact a doctor if:  Your symptoms last more than 24 hours.  Your symptoms occur more often. Get help right away if you:  Have chest pain.  Feel short of breath.  Have a very bad headache.  Feel dizzy.  Pass out (faint). Summary  Palpitations are feelings that your heartbeat is uneven or faster than normal. It may feel like your heart is fluttering or skipping a beat.  Avoid food and drinks that may cause palpitations. These include caffeine, chocolate, and alcohol.  Try  to lower your stress. Do not smoke or use drugs.  Get help right away if you faint or have chest pain, shortness of breath, a severe headache, or dizziness. This information is not intended to replace advice given to you by your health care provider. Make sure you discuss any questions you have with your health care provider. Document Revised: 04/11/2017 Document Reviewed: 04/11/2017 Elsevier Patient Education  [employer].   Dizziness Dizziness is a common problem. It makes you feel unsteady or light-headed. You may feel like you are about to pass out (faint). Dizziness can lead to getting hurt if you stumble or fall. Dizziness can be caused by many things, including:  Medicines.  Not having enough water in your body (dehydration).  Illness. Follow these instructions at home: Eating and drinking   Drink enough fluid to keep your pee (urine) clear or pale yellow. This helps to keep you from getting dehydrated. Try to drink more clear fluids, such as water.  Do not drink alcohol.  Limit how much caffeine you drink or eat, if your doctor tells you to do that.  Limit how much salt (sodium) you drink or eat, if your doctor tells you to do that. Activity   Avoid making quick movements. ? When you stand up from sitting in a chair, steady yourself until you feel okay. ? In the morning, first sit up on the side of the bed. When you feel okay, stand slowly while you hold onto something. Do this until you know that your balance  is fine.  If you need to stand in one place for a long time, move your legs often. Tighten and relax the muscles in your legs while you are standing.  Do not drive or use heavy machinery if you feel dizzy.  Avoid bending down if you feel dizzy. Place items in your home so you can reach them easily without leaning over. Lifestyle  Do not use any products that contain nicotine or tobacco, such as cigarettes and e-cigarettes. If you need help quitting, ask  your doctor.  Try to lower your stress level. You can do this by using methods such as yoga or meditation. Talk with your doctor if you need help. General instructions  Watch your dizziness for any changes.  Take over-the-counter and prescription medicines only as told by your doctor. Talk with your doctor if you think that you are dizzy because of a medicine that you are taking.  Tell a friend or a family member that you are feeling dizzy. If he or she notices any changes in your behavior, have this person call your doctor.  Keep all follow-up visits as told by your doctor. This is important. Contact a doctor if:  Your dizziness does not go away.  Your dizziness or light-headedness gets worse.  You feel sick to your stomach (nauseous).  You have trouble hearing.  You have new symptoms.  You are unsteady on your feet.  You feel like the room is spinning. Get help right away if:  You throw up (vomit) or have watery poop (diarrhea), and you cannot eat or drink anything.  You have trouble: ? Talking. ? Walking. ? Swallowing. ? Using your arms, hands, or legs.  You feel generally weak.  You are not thinking clearly, or you have trouble forming sentences. A friend or family member may notice this.  You have: ? Chest pain. ? Pain in your belly (abdomen). ? Shortness of breath. ? Sweating.  Your vision changes.  You are bleeding.  You have a very bad headache.  You have neck pain or a stiff neck.  You have a fever. These symptoms may be an emergency. Do not wait to see if the symptoms will go away. Get medical help right away. Call your local emergency services (911 in the [place] Do not drive yourself to the hospital. Summary  Dizziness makes you feel unsteady or light-headed. You may feel like you are about to pass out (faint).  Drink enough fluid to keep your pee (urine) clear or pale yellow. Do not drink alcohol.  Avoid making quick movements if you feel  dizzy.  Watch your dizziness for any changes. This information is not intended to replace advice given to you by your health care provider. Make sure you discuss any questions you have with your health care provider. Document Revised: 03/02/2017 Document Reviewed: 03/16/2016 Elsevier Patient Education  2020 [employer] Inc[employer]    High Cholesterol  High cholesterol is a condition in which the blood has high levels of a white, waxy, fat-like substance (cholesterol). The human body needs small amounts of cholesterol. The liver makes all the cholesterol that the body needs. Extra (excess) cholesterol comes from the food that we eat. Cholesterol is carried from the liver by the blood through the blood vessels. If you have high cholesterol, deposits (plaques) may build up on the walls of your blood vessels (arteries). Plaques make the arteries narrower and stiffer. Cholesterol plaques increase your risk for heart attack and stroke. Work with your  health care provider to keep your cholesterol levels in a healthy range. What increases the risk? This condition is more likely to develop in people who:  Eat foods that are high in animal fat (saturated fat) or cholesterol.  Are overweight.  Are not getting enough exercise.  Have a family history of high cholesterol. What are the signs or symptoms? There are no symptoms of this condition. How is this diagnosed? This condition may be diagnosed from the results of a blood test.  If you are older than age 58, your health care provider may check your cholesterol every 4-6 years.  You may be checked more often if you already have high cholesterol or other risk factors for heart disease. The blood test for cholesterol measures:  "Bad" cholesterol (LDL cholesterol). This is the main type of cholesterol that causes heart disease. The desired level for LDL is less than 100.  "Good" cholesterol (HDL cholesterol). This type helps to protect against heart  disease by cleaning the arteries and carrying the LDL away. The desired level for HDL is 60 or higher.  Triglycerides. These are fats that the body can store or burn for energy. The desired number for triglycerides is lower than 150.  Total cholesterol. This is a measure of the total amount of cholesterol in your blood, including LDL cholesterol, HDL cholesterol, and triglycerides. A healthy number is less than 200. How is this treated? This condition is treated with diet changes, lifestyle changes, and medicines. Diet changes  This may include eating more whole grains, fruits, vegetables, nuts, and fish.  This may also include cutting back on red meat and foods that have a lot of added sugar. Lifestyle changes  Changes may include getting at least 40 minutes of aerobic exercise 3 times a week. Aerobic exercises include walking, biking, and swimming. Aerobic exercise along with a healthy diet can help you maintain a healthy weight.  Changes may also include quitting smoking. Medicines  Medicines are usually given if diet and lifestyle changes have failed to reduce your cholesterol to healthy levels.  Your health care provider may prescribe a statin medicine. Statin medicines have been shown to reduce cholesterol, which can reduce the risk of heart disease. Follow these instructions at home: Eating and drinking If told by your health care provider:  Eat chicken (without skin), fish, veal, shellfish, ground [place] breast, and round or loin cuts of red meat.  Do not eat fried foods or fatty meats, such as hot dogs and salami.  Eat plenty of fruits, such as apples.  Eat plenty of vegetables, such as broccoli, potatoes, and carrots.  Eat beans, peas, and lentils.  Eat grains such as barley, rice, couscous, and bulgur wheat.  Eat pasta without cream sauces.  Use skim or nonfat milk, and eat low-fat or nonfat yogurt and cheeses.  Do not eat or drink whole milk, cream, ice cream,  egg yolks, or hard cheeses.  Do not eat stick margarine or tub margarines that contain trans fats (also called partially hydrogenated oils).  Do not eat saturated tropical oils, such as coconut oil and palm oil.  Do not eat cakes, cookies, crackers, or other baked goods that contain trans fats.  General instructions  Exercise as directed by your health care provider. Increase your activity level with activities such as gardening, walking, and taking the stairs.  Take over-the-counter and prescription medicines only as told by your health care provider.  Do not use any products that contain nicotine or  tobacco, such as cigarettes and e-cigarettes. If you need help quitting, ask your health care provider.  Keep all follow-up visits as told by your health care provider. This is important. Contact a health care provider if:  You are struggling to maintain a healthy diet or weight.  You need help to start on an exercise program.  You need help to stop smoking. Get help right away if:  You have chest pain.  You have trouble breathing. This information is not intended to replace advice given to you by your health care provider. Make sure you discuss any questions you have with your health care provider. Document Revised: 03/02/2017 Document Reviewed: 08/28/2015 Elsevier Patient Education  2020 [employer].

## 2019-05-06 NOTE — Assessment & Plan Note (Addendum)
Depression not well controlled, medication changed, education provided to patient  With teach back, Pt verbalize understanding. Written instructions available to patients for pick up. Rx sent to pharmacy.

## 2019-05-07 LAB — CBC WITH DIFFERENTIAL/PLATELET
Basophils Absolute: 0.1 10*3/uL (ref 0.0–0.2)
Basos: 2 %
EOS (ABSOLUTE): 0.4 10*3/uL (ref 0.0–0.4)
Eos: 5 %
Hematocrit: 39.8 % (ref 34.0–46.6)
Hemoglobin: 13.6 g/dL (ref 11.1–15.9)
Immature Grans (Abs): 0 10*3/uL (ref 0.0–0.1)
Immature Granulocytes: 0 %
Lymphocytes Absolute: 1.5 10*3/uL (ref 0.7–3.1)
Lymphs: 22 %
MCH: 31.8 pg (ref 26.6–33.0)
MCHC: 34.2 g/dL (ref 31.5–35.7)
MCV: 93 fL (ref 79–97)
Monocytes Absolute: 0.7 10*3/uL (ref 0.1–0.9)
Monocytes: 10 %
Neutrophils Absolute: 4.2 10*3/uL (ref 1.4–7.0)
Neutrophils: 61 %
Platelets: 295 10*3/uL (ref 150–450)
RBC: 4.28 x10E6/uL (ref 3.77–5.28)
RDW: 14 % (ref 11.7–15.4)
WBC: 6.7 10*3/uL (ref 3.4–10.8)

## 2019-05-07 LAB — COMPREHENSIVE METABOLIC PANEL
ALT: 34 IU/L — ABNORMAL HIGH (ref 0–32)
AST: 29 IU/L (ref 0–40)
Albumin/Globulin Ratio: 1.5 (ref 1.2–2.2)
Albumin: 4.1 g/dL (ref 3.8–4.9)
Alkaline Phosphatase: 118 IU/L — ABNORMAL HIGH (ref 39–117)
BUN/Creatinine Ratio: 18 (ref 9–23)
BUN: 14 mg/dL (ref 6–24)
Bilirubin Total: 0.3 mg/dL (ref 0.0–1.2)
CO2: 23 mmol/L (ref 20–29)
Calcium: 9.3 mg/dL (ref 8.7–10.2)
Chloride: 102 mmol/L (ref 96–106)
Creatinine, Ser: 0.78 mg/dL (ref 0.57–1.00)
GFR calc Af Amer: 96 mL/min/{1.73_m2} (ref >59)
GFR calc non Af Amer: 83 mL/min/{1.73_m2} (ref >59)
Globulin, Total: 2.8 g/dL (ref 1.5–4.5)
Glucose: 109 mg/dL — ABNORMAL HIGH (ref 65–99)
Potassium: 3.8 mmol/L (ref 3.5–5.2)
Sodium: 141 mmol/L (ref 134–144)
Total Protein: 6.9 g/dL (ref 6.0–8.5)

## 2019-05-07 LAB — LIPID PANEL
Chol/HDL Ratio: 3.3 ratio (ref 0.0–4.4)
Cholesterol, Total: 173 mg/dL (ref 100–199)
HDL: 53 mg/dL (ref >39)
LDL Chol Calc (NIH): 98 mg/dL (ref 0–99)
Triglycerides: 123 mg/dL (ref 0–149)
VLDL Cholesterol Cal: 22 mg/dL (ref 5–40)

## 2019-05-07 LAB — CARDIOVASCULAR RISK ASSESSMENT

## 2019-05-09 ENCOUNTER — Other Ambulatory Visit: Payer: Self-pay

## 2019-05-09 ENCOUNTER — Encounter: Payer: Self-pay | Admitting: Cardiology

## 2019-05-09 ENCOUNTER — Ambulatory Visit (INDEPENDENT_AMBULATORY_CARE_PROVIDER_SITE_OTHER): Payer: BC Managed Care – PPO | Admitting: Cardiology

## 2019-05-09 VITALS — BP 154/86 | HR 112 | Ht 61.5 in | Wt 120.0 lb

## 2019-05-09 DIAGNOSIS — E782 Mixed hyperlipidemia: Secondary | ICD-10-CM | POA: Diagnosis not present

## 2019-05-09 DIAGNOSIS — Z1329 Encounter for screening for other suspected endocrine disorder: Secondary | ICD-10-CM | POA: Diagnosis not present

## 2019-05-09 DIAGNOSIS — R002 Palpitations: Secondary | ICD-10-CM

## 2019-05-09 DIAGNOSIS — R011 Cardiac murmur, unspecified: Secondary | ICD-10-CM | POA: Diagnosis not present

## 2019-05-09 HISTORY — DX: Palpitations: R00.2

## 2019-05-09 HISTORY — DX: Cardiac murmur, unspecified: R01.1

## 2019-05-09 MED ORDER — METOPROLOL SUCCINATE ER 25 MG PO TB24: 25.0000 mg | ORAL_TABLET | Freq: Every day | ORAL | 12 refills | Status: DC

## 2019-05-09 NOTE — Patient Instructions (Signed)
Medication Instructions:  Your physician has recommended you make the following change in your medication:   Start Torpol XL 25 mg daily.  *If you need a refill on your cardiac medications before your next appointment, please call your pharmacy*   Lab Work: You had a BMET and TSH today. If you have labs (blood work) drawn today and your tests are completely normal, you will receive your results only by: Marland Kitchen MyChart Message (if you have MyChart) OR . A paper copy in the mail If you have any lab test that is abnormal or we need to change your treatment, we will call you to review the results.   Testing/Procedures: Your physician has requested that you have an echocardiogram. Echocardiography is a painless test that uses sound waves to create images of your heart. It provides your doctor with information about the size and shape of your heart and how well your heart's chambers and valves are working. This procedure takes approximately one hour. There are no restrictions for this procedure.     Follow-Up: At [facility], you and your health needs are our priority.  As part of our continuing mission to provide you with exceptional heart care, we have created designated Provider Care Teams.  These Care Teams include your primary Cardiologist (physician) and Advanced Practice Providers (APPs -  Physician Assistants and Nurse Practitioners) who all work together to provide you with the care you need, when you need it.  We recommend signing up for the patient portal called "MyChart".  Sign up information is provided on this After Visit Summary.  MyChart is used to connect with patients for Virtual Visits (Telemedicine).  Patients are able to view lab/test results, encounter notes, upcoming appointments, etc.  Non-urgent messages can be sent to your provider as well.   To learn more about what you can do with MyChart, go to [email or web address].    Your next appointment:   1 month(s)  The  format for your next appointment:   In Person  Provider:   Belva Crome, MD   Other Instructions  Echocardiogram An echocardiogram is a procedure that uses painless sound waves (ultrasound) to produce an image of the heart. Images from an echocardiogram can provide important information about:  Signs of coronary artery disease (CAD).  Aneurysm detection. An aneurysm is a weak or damaged part of an artery wall that bulges out from the normal force of blood pumping through the body.  Heart size and shape. Changes in the size or shape of the heart can be associated with certain conditions, including heart failure, aneurysm, and CAD.  Heart muscle function.  Heart valve function.  Signs of a past heart attack.  Fluid buildup around the heart.  Thickening of the heart muscle.  A tumor or infectious growth around the heart valves. Tell a health care provider about:  Any allergies you have.  All medicines you are taking, including vitamins, herbs, eye drops, creams, and over-the-counter medicines.  Any blood disorders you have.  Any surgeries you have had.  Any medical conditions you have.  Whether you are pregnant or may be pregnant. What are the risks? Generally, this is a safe procedure. However, problems may occur, including:  Allergic reaction to dye (contrast) that may be used during the procedure. What happens before the procedure? No specific preparation is needed. You may eat and drink normally. What happens during the procedure?   An IV tube may be inserted into one of your veins.  You  may receive contrast through this tube. A contrast is an injection that improves the quality of the pictures from your heart.  A gel will be applied to your chest.  A wand-like tool (transducer) will be moved over your chest. The gel will help to transmit the sound waves from the transducer.  The sound waves will harmlessly bounce off of your heart to allow the heart  images to be captured in real-time motion. The images will be recorded on a computer. The procedure may vary among health care providers and hospitals. What happens after the procedure?  You may return to your normal, everyday life, including diet, activities, and medicines, unless your health care provider tells you not to do that. Summary  An echocardiogram is a procedure that uses painless sound waves (ultrasound) to produce an image of the heart.  Images from an echocardiogram can provide important information about the size and shape of your heart, heart muscle function, heart valve function, and fluid buildup around your heart.  You do not need to do anything to prepare before this procedure. You may eat and drink normally.  After the echocardiogram is completed, you may return to your normal, everyday life, unless your health care provider tells you not to do that. This information is not intended to replace advice given to you by your health care provider. Make sure you discuss any questions you have with your health care provider. Document Revised: 06/20/2018 Document Reviewed: 04/01/2016 Elsevier Patient Education  2020 [employer].

## 2019-05-09 NOTE — Progress Notes (Signed)
Cardiology Office Note:    Date:  05/09/2019   ID:  Kimberly David, DOB March 28, 1960, MRN [id number]  PCP:  Kimberly Drown, NP  Cardiologist:  Kimberly Brothers, MD   Referring MD: Kimberly Drown, NP    ASSESSMENT:    1. Mixed hyperlipidemia   2. Palpitations   3. Palpitation    PLAN:    In order of problems listed above:  1. Palpitations I discussed my findings with the patient at length.  I told her to keep herself well-hydrated.  We will check her TSH today and a Chem-7.  I will initiate her on Toprol-XL 25 mg daily. Cardiac murmur: Echocardiogram will be done to assess murmur heard on auscultation. Mixed dyslipidemia: Diet was discussed with her at extensive length.  She seems to be compliant with it. She will be seen in follow-up appointment in a month or earlier if she has any concerns.  She knows to go to the nearest emergency room for any concerning symptoms.   Medication Adjustments/Labs and Tests Ordered: Current medicines are reviewed at length with the patient today.  Concerns regarding medicines are outlined above.  No orders of the defined types were placed in this encounter.  No orders of the defined types were placed in this encounter.    History of Present Illness:    Kimberly David is a 59 y.o. female who is being seen today for the evaluation of palpitations and dyspnea at the request of Kimberly Drown, NP.  Patient is a pleasant 59 year old female.  She has past medical history of dyslipidemia.  She mentions to me that she had COVID-19 pneumonia and is improving subsequently.  She denies any chest pain orthopnea or PND.  She mentions to me that she feels fatigued when she exerts herself.  She denies any dizzy spells or syncopal episodes.  She gives history of palpitations.  She feels that her heart is beating fast all the time.  At the time of my evaluation, the patient is alert awake oriented and in no distress.  Past Medical History:    Diagnosis Date  . Anxiety   . COVID-19   . Depression   . Ganglion cyst of wrist    Rt  . Hyperlipidemia    takes lipitor    Past Surgical History:  Procedure Laterality Date  . CESAREAN SECTION  02/08/2001  . GANGLION CYST EXCISION  01/11/2012   Procedure: REMOVAL GANGLION OF WRIST;  Surgeon: Tami Ribas, MD;  Location: [facility];  Service: Orthopedics;  Laterality: Right;  right wrist excision mass     Current Medications: Current Meds  Medication Sig  . albuterol (PROAIR HFA) 108 (90 Base) MCG/ACT inhaler ProAir HFA 90 mcg/actuation aerosol inhaler  . ALPRAZolam (XANAX) 0.25 MG tablet Take 1 tablet (0.25 mg total) by mouth at bedtime as needed.  Marland Kitchen amitriptyline (ELAVIL) 50 MG tablet Take 50 mg by mouth at bedtime.  Marland Kitchen atorvastatin (LIPITOR) 20 MG tablet Take 20 mg by mouth daily.  . cyclobenzaprine (FLEXERIL) 10 MG tablet Take 10 mg by mouth at bedtime.  . meclizine (ANTIVERT) 25 MG tablet meclizine 25 mg tablet  . meloxicam (MOBIC) 7.5 MG tablet Take 7.5 mg by mouth daily.  . norethindrone-ethinyl estradiol (FEMHRT 1/5) 1-5 MG-MCG TABS Take by mouth daily.  Marland Kitchen tolterodine (DETROL LA) 2 MG 24 hr capsule Take 2 mg by mouth daily.  Marland Kitchen venlafaxine XR (EFFEXOR XR) 75 MG 24 hr capsule Take 1  capsule (75 mg total) by mouth daily with breakfast.     Allergies:   Codeine   Social History   Socioeconomic History  . Marital status: Legally Separated    Spouse name: Not on file  . Number of children: 3  . Years of education: Not on file  . Highest education level: Not on file  Occupational History  . Not on file  Tobacco Use  . Smoking status: Never Smoker  . Smokeless tobacco: Never Used  Substance and Sexual Activity  . Alcohol use: No  . Drug use: No  . Sexual activity: Yes    Birth control/protection: Post-menopausal  Other Topics Concern  . Not on file  Social History Narrative  . Not on file   Social Determinants of Health   Financial Resource  Strain:   . Difficulty of Paying Living Expenses: Not on file  Food Insecurity:   . Worried About Charity fundraiser in the Last Year: Not on file  . Ran Out of Food in the Last Year: Not on file  Transportation Needs:   . Lack of Transportation (Medical): Not on file  . Lack of Transportation (Non-Medical): Not on file  Physical Activity:   . Days of Exercise per Week: Not on file  . Minutes of Exercise per Session: Not on file  Stress:   . Feeling of Stress : Not on file  Social Connections:   . Frequency of Communication with Friends and Family: Not on file  . Frequency of Social Gatherings with Friends and Family: Not on file  . Attends Religious Services: Not on file  . Active Member of Clubs or Organizations: Not on file  . Attends Archivist Meetings: Not on file  . Marital Status: Not on file     Family History: The patient's family history includes Diabetes type I in her daughter; Diabetes type II in her sister; Heart attack in her mother; Hyperlipidemia in her mother; Lung cancer in her father; Migraines in her mother and sister.  ROS:   Please see the history of present illness.    All other systems reviewed and are negative.  EKGs/Labs/Other Studies Reviewed:    The following studies were reviewed today: EKG reveals sinus tachycardia and nonspecific ST-T changes   Recent Labs: 05/06/2019: ALT 34; BUN 14; Creatinine, Ser 0.78; Hemoglobin 13.6; Platelets 295; Potassium 3.8; Sodium 141  Recent Lipid Panel    Component Value Date/Time   CHOL 173 05/06/2019 0955   TRIG 123 05/06/2019 0955   HDL 53 05/06/2019 0955   CHOLHDL 3.3 05/06/2019 0955   LDLCALC 98 05/06/2019 0955    Physical Exam:    VS:  BP (!) 154/86   Pulse (!) 112   Ht 5' 1.5" (1.562 m)   Wt 120 lb (54.4 kg)   SpO2 99%   BMI 22.31 kg/m     Wt Readings from Last 3 Encounters:  05/09/19 120 lb (54.4 kg)  05/06/19 120 lb (54.4 kg)  01/11/12 115 lb (52.2 kg)     GEN: Patient is  in no acute distress HEENT: Normal NECK: No JVD; No carotid bruits LYMPHATICS: No lymphadenopathy CARDIAC: S1 S2 regular, 2/6 systolic murmur at the apex. RESPIRATORY:  Clear to auscultation without rales, wheezing or rhonchi  ABDOMEN: Soft, non-tender, non-distended MUSCULOSKELETAL:  No edema; No deformity  SKIN: Warm and dry NEUROLOGIC:  Alert and oriented x 3 PSYCHIATRIC:  Normal affect    Signed, Jenean Lindau, MD  05/09/2019  1:39 PM    [facility]

## 2019-05-10 LAB — BASIC METABOLIC PANEL
BUN/Creatinine Ratio: 15 (ref 9–23)
BUN: 10 mg/dL (ref 6–24)
CO2: 23 mmol/L (ref 20–29)
Calcium: 9.3 mg/dL (ref 8.7–10.2)
Chloride: 104 mmol/L (ref 96–106)
Creatinine, Ser: 0.67 mg/dL (ref 0.57–1.00)
GFR calc Af Amer: 111 mL/min/{1.73_m2} (ref >59)
GFR calc non Af Amer: 97 mL/min/{1.73_m2} (ref >59)
Glucose: 93 mg/dL (ref 65–99)
Potassium: 3.5 mmol/L (ref 3.5–5.2)
Sodium: 143 mmol/L (ref 134–144)

## 2019-05-10 LAB — TSH: TSH: 0.983 u[IU]/mL (ref 0.450–4.500)

## 2019-05-13 ENCOUNTER — Other Ambulatory Visit: Payer: Self-pay

## 2019-05-13 ENCOUNTER — Ambulatory Visit (INDEPENDENT_AMBULATORY_CARE_PROVIDER_SITE_OTHER): Payer: BC Managed Care – PPO

## 2019-05-13 DIAGNOSIS — R002 Palpitations: Secondary | ICD-10-CM | POA: Diagnosis not present

## 2019-05-13 NOTE — Progress Notes (Signed)
Complete echocardiogram has been performed.  Jimmy Amyrah Pinkhasov RDCS, RVT 

## 2019-05-21 ENCOUNTER — Other Ambulatory Visit: Payer: Self-pay

## 2019-05-21 MED ORDER — AMITRIPTYLINE HCL 50 MG PO TABS: 50.0000 mg | ORAL_TABLET | Freq: Every day | ORAL | 0 refills | Status: DC

## 2019-06-06 ENCOUNTER — Ambulatory Visit (INDEPENDENT_AMBULATORY_CARE_PROVIDER_SITE_OTHER): Payer: BC Managed Care – PPO | Admitting: Cardiology

## 2019-06-06 ENCOUNTER — Encounter: Payer: Self-pay | Admitting: Cardiology

## 2019-06-06 ENCOUNTER — Other Ambulatory Visit: Payer: Self-pay

## 2019-06-06 VITALS — BP 140/90 | HR 90 | Ht 61.5 in | Wt 125.0 lb

## 2019-06-06 DIAGNOSIS — R002 Palpitations: Secondary | ICD-10-CM | POA: Diagnosis not present

## 2019-06-06 DIAGNOSIS — E782 Mixed hyperlipidemia: Secondary | ICD-10-CM

## 2019-06-06 MED ORDER — METOPROLOL SUCCINATE ER 25 MG PO TB24: 25.0000 mg | ORAL_TABLET | Freq: Two times a day (BID) | ORAL | 3 refills | Status: DC

## 2019-06-06 NOTE — Patient Instructions (Signed)
Medication Instructions:  Your physician has recommended you make the following change in your medication:   Increase Metoprolol Succinate (Toprol XL) 25 mg twice daily.  *If you need a refill on your cardiac medications before your next appointment, please call your pharmacy*   Lab Work: None orderd If you have labs (blood work) drawn today and your tests are completely normal, you will receive your results only by: Marland Kitchen MyChart Message (if you have MyChart) OR . A paper copy in the mail If you have any lab test that is abnormal or we need to change your treatment, we will call you to review the results.   Testing/Procedures: None ordered   Follow-Up: At [facility], you and your health needs are our priority.  As part of our continuing mission to provide you with exceptional heart care, we have created designated Provider Care Teams.  These Care Teams include your primary Cardiologist (physician) and Advanced Practice Providers (APPs -  Physician Assistants and Nurse Practitioners) who all work together to provide you with the care you need, when you need it.  We recommend signing up for the patient portal called "MyChart".  Sign up information is provided on this After Visit Summary.  MyChart is used to connect with patients for Virtual Visits (Telemedicine).  Patients are able to view lab/test results, encounter notes, upcoming appointments, etc.  Non-urgent messages can be sent to your provider as well.   To learn more about what you can do with MyChart, go to [email or web address].    Your next appointment:   1 month(s)  The format for your next appointment:   In Person  Provider:   Belva Crome, MD   Other Instructions NA

## 2019-06-06 NOTE — Progress Notes (Signed)
Cardiology Office Note:    Date:  06/06/2019   ID:  Kimberly David, DOB [birth year], MRN [id number]  PCP:  Daryll Drown, NP  Cardiologist:  Garwin Brothers, MD   Referring MD: Daryll Drown, NP    ASSESSMENT:    1. Mixed hyperlipidemia   2. Palpitations    PLAN:    In order of problems listed above:  1. Palpitations I discussed my findings with the patient extensively.  Her heart rate is still elevated.  His blood work from recent was fine including TSH.  She is happy about it.  I doubled her metoprolol to 50 mg daily.  She will keep a track of her heart rates and will see me in follow-up appointment in a month. 2. Mixed dyslipidemia: Diet was emphasized and lipids were reviewed and she is doing well with this. 3. She will be seen in follow-up appointment in a month or earlier if she has any concerns.   Medication Adjustments/Labs and Tests Ordered: Current medicines are reviewed at length with the patient today.  Concerns regarding medicines are outlined above.  No orders of the defined types were placed in this encounter.  No orders of the defined types were placed in this encounter.    Chief Complaint  Patient presents with  . Follow-up    1 Month     History of Present Illness:    Kimberly David is a 59 y.o. female.  Patient has history of COVID-19 infection and pneumonitis.  Subsequently she is done better.  No chest pain orthopnea or PND.  She gives history of palpitations.  They have resolved some but not completely.  Her resting heart rate is still elevated and gets up significantly with exercise.  No chest pain orthopnea or PND.  At the time of my evaluation, the patient is alert awake oriented and in no distress.  Past Medical History:  Diagnosis Date  . Anxiety   . COVID-19   . Depression   . Ganglion cyst of wrist    Rt  . Hyperlipidemia    takes lipitor    Past Surgical History:  Procedure Laterality Date  . CESAREAN SECTION   02/08/2001  . GANGLION CYST EXCISION  01/11/2012   Procedure: REMOVAL GANGLION OF WRIST;  Surgeon: Tami Ribas, MD;  Location: [facility];  Service: Orthopedics;  Laterality: Right;  right wrist excision mass     Current Medications: Current Meds  Medication Sig  . albuterol (PROAIR HFA) 108 (90 Base) MCG/ACT inhaler ProAir HFA 90 mcg/actuation aerosol inhaler  . ALPRAZolam (XANAX) 0.25 MG tablet Take 1 tablet (0.25 mg total) by mouth at bedtime as needed.  Marland Kitchen amitriptyline (ELAVIL) 50 MG tablet Take 1 tablet (50 mg total) by mouth at bedtime.  Marland Kitchen atorvastatin (LIPITOR) 20 MG tablet Take 20 mg by mouth daily.  . cyclobenzaprine (FLEXERIL) 10 MG tablet Take 10 mg by mouth at bedtime.  . meclizine (ANTIVERT) 25 MG tablet meclizine 25 mg tablet  . meloxicam (MOBIC) 7.5 MG tablet Take 7.5 mg by mouth daily.  . metoprolol succinate (TOPROL XL) 25 MG 24 hr tablet Take 1 tablet (25 mg total) by mouth daily.  . norethindrone-ethinyl estradiol (FEMHRT 1/5) 1-5 MG-MCG TABS Take by mouth daily.  Marland Kitchen tolterodine (DETROL LA) 2 MG 24 hr capsule Take 2 mg by mouth daily.  Marland Kitchen venlafaxine XR (EFFEXOR XR) 75 MG 24 hr capsule Take 1 capsule (75 mg total) by mouth daily with  breakfast.     Allergies:   Codeine   Social History   Socioeconomic History  . Marital status: Legally Separated    Spouse name: Not on file  . Number of children: 3  . Years of education: Not on file  . Highest education level: Not on file  Occupational History  . Not on file  Tobacco Use  . Smoking status: Never Smoker  . Smokeless tobacco: Never Used  Substance and Sexual Activity  . Alcohol use: No  . Drug use: No  . Sexual activity: Yes    Birth control/protection: Post-menopausal  Other Topics Concern  . Not on file  Social History Narrative  . Not on file   Social Determinants of Health   Financial Resource Strain:   . Difficulty of Paying Living Expenses:   Food Insecurity:   . Worried About  Charity fundraiser in the Last Year:   . Arboriculturist in the Last Year:   Transportation Needs:   . Film/video editor (Medical):   Marland Kitchen Lack of Transportation (Non-Medical):   Physical Activity:   . Days of Exercise per Week:   . Minutes of Exercise per Session:   Stress:   . Feeling of Stress :   Social Connections:   . Frequency of Communication with Friends and Family:   . Frequency of Social Gatherings with Friends and Family:   . Attends Religious Services:   . Active Member of Clubs or Organizations:   . Attends Archivist Meetings:   Marland Kitchen Marital Status:      Family History: The patient's family history includes Diabetes type I in her daughter; Diabetes type II in her sister; Heart attack in her mother; Hyperlipidemia in her mother; Lung cancer in her father; Migraines in her mother and sister.  ROS:   Please see the history of present illness.    All other systems reviewed and are negative.  EKGs/Labs/Other Studies Reviewed:    The following studies were reviewed today: IMPRESSIONS    1. Technically difficult study - pt tachycardic.  2. Left ventricular ejection fraction, by estimation, is 60 to 65%. The  left ventricle has normal function. The left ventricle has no regional  wall motion abnormalities. Left ventricular diastolic parameters are  consistent with Grade I diastolic  dysfunction (impaired relaxation).  3. Right ventricular systolic function is normal. The right ventricular  size is normal. There is normal pulmonary artery systolic pressure.  4. The mitral valve is normal in structure and function. No evidence of  mitral valve regurgitation. No evidence of mitral stenosis.  5. The aortic valve is normal in structure and function. Aortic valve  regurgitation is mild. No aortic stenosis is present.  6. The inferior vena cava is normal in size with greater than 50%  respiratory variability, suggesting right atrial pressure of 3 mmHg.     Recent Labs: 05/06/2019: ALT 34; Hemoglobin 13.6; Platelets 295 05/09/2019: BUN 10; Creatinine, Ser 0.67; Potassium 3.5; Sodium 143; TSH 0.983  Recent Lipid Panel    Component Value Date/Time   CHOL 173 05/06/2019 0955   TRIG 123 05/06/2019 0955   HDL 53 05/06/2019 0955   CHOLHDL 3.3 05/06/2019 0955   LDLCALC 98 05/06/2019 0955    Physical Exam:    VS:  BP 140/90   Pulse 90   Ht 5' 1.5" (1.562 m)   Wt 125 lb (56.7 kg)   SpO2 98%   BMI 23.24 kg/m  Wt Readings from Last 3 Encounters:  06/06/19 125 lb (56.7 kg)  05/09/19 120 lb (54.4 kg)  05/06/19 120 lb (54.4 kg)     GEN: Patient is in no acute distress HEENT: Normal NECK: No JVD; No carotid bruits LYMPHATICS: No lymphadenopathy CARDIAC: Hear sounds regular, 2/6 systolic murmur at the apex. RESPIRATORY:  Clear to auscultation without rales, wheezing or rhonchi  ABDOMEN: Soft, non-tender, non-distended MUSCULOSKELETAL:  No edema; No deformity  SKIN: Warm and dry NEUROLOGIC:  Alert and oriented x 3 PSYCHIATRIC:  Normal affect   Signed, Garwin Brothers, MD  06/06/2019 2:20 PM    [facility]

## 2019-06-10 DIAGNOSIS — Z20828 Contact with and (suspected) exposure to other viral communicable diseases: Secondary | ICD-10-CM | POA: Diagnosis not present

## 2019-06-10 DIAGNOSIS — R509 Fever, unspecified: Secondary | ICD-10-CM | POA: Diagnosis not present

## 2019-06-10 DIAGNOSIS — R197 Diarrhea, unspecified: Secondary | ICD-10-CM | POA: Diagnosis not present

## 2019-06-16 ENCOUNTER — Ambulatory Visit: Payer: BC Managed Care – PPO | Admitting: Nurse Practitioner

## 2019-06-16 ENCOUNTER — Ambulatory Visit (INDEPENDENT_AMBULATORY_CARE_PROVIDER_SITE_OTHER): Payer: BC Managed Care – PPO | Admitting: Nurse Practitioner

## 2019-06-16 ENCOUNTER — Other Ambulatory Visit: Payer: Self-pay

## 2019-06-16 ENCOUNTER — Encounter: Payer: Self-pay | Admitting: Nurse Practitioner

## 2019-06-16 VITALS — BP 140/88 | HR 108 | Temp 97.7°F | Ht 61.5 in | Wt 122.8 lb

## 2019-06-16 DIAGNOSIS — R002 Palpitations: Secondary | ICD-10-CM | POA: Diagnosis not present

## 2019-06-16 DIAGNOSIS — F419 Anxiety disorder, unspecified: Secondary | ICD-10-CM

## 2019-06-16 DIAGNOSIS — F331 Major depressive disorder, recurrent, moderate: Secondary | ICD-10-CM

## 2019-06-16 HISTORY — DX: Major depressive disorder, recurrent, moderate: F33.1

## 2019-06-16 MED ORDER — ALPRAZOLAM 0.25 MG PO TABS: 0.2500 mg | ORAL_TABLET | Freq: Every evening | ORAL | 0 refills | Status: DC | PRN

## 2019-06-16 MED ORDER — METOPROLOL SUCCINATE ER 50 MG PO TB24: 50.0000 mg | ORAL_TABLET | Freq: Two times a day (BID) | ORAL | 3 refills | Status: DC

## 2019-06-16 MED ORDER — VENLAFAXINE HCL ER 75 MG PO CP24: 75.0000 mg | ORAL_CAPSULE | Freq: Every day | ORAL | 0 refills | Status: DC

## 2019-06-16 MED ORDER — UBRELVY 50 MG PO TABS: 50.0000 mg | ORAL_TABLET | ORAL | 0 refills | Status: DC | PRN

## 2019-06-16 NOTE — Assessment & Plan Note (Signed)
Patient continues to have elevated pulse. metoprolol increased to 50 mg daily, provided education to patient on management. Follow up in 2 weeks. Rx sent to pharmacy

## 2019-06-16 NOTE — Patient Instructions (Addendum)

## 2019-06-16 NOTE — Progress Notes (Signed)
Established Patient Office Visit  Subjective:  Patient ID: Kimberly David, female    DOB: 1961-01-04  Age: 59 y.o. MRN: [id number]  CC: Patient  is a 59 year old female she is here for palpitation and depression medication follow up.  The patient's medications were reviewed and reconciled since the patient's last visit.  History details were provided by the patient. The history appears to be reliable.    Chief Complaint  Patient presents with  . Follow-up    HPI Kimberly David Palpitations  This is a recurrent problem. The current episode started 1 to 4 weeks ago. The problem occurs constantly. The problem has been gradually improving. The symptoms are aggravated by fear and stress (after Covid-19 infections). Associated symptoms include anxiety. Pertinent negatives include no chest pain, coughing, shortness of breath or vomiting. She has tried beta blockers for the symptoms. The treatment provided mild relief. Her past medical history is significant for anxiety.    Concerning patients depression and anxiety, Tailor is improving and well managed on current medication, patient is compliant with current medication, Effexor XR 75 mg daily. Patient has no adverse effects from medication.  she denies suicidal ideation, she has no thoughts of hurting herself or others.  Depression screen [facility] 2/9 06/16/2019 05/06/2019  Decreased Interest 0 1  Down, Depressed, Hopeless 1 3  PHQ - 2 Score 1 4  Altered sleeping 1 3  Tired, decreased energy 1 3  Change in appetite 1 2  Feeling bad or failure about yourself  0 2  Trouble concentrating 0 3  Moving slowly or fidgety/restless 0 2  Suicidal thoughts 0 2  PHQ-9 Score 4 21  Difficult doing work/chores Somewhat difficult -      Past Medical History:  Diagnosis Date  . Anxiety   . COVID-19   . Depression   . Ganglion cyst of wrist    Rt  . Hyperlipidemia    takes lipitor    Past Surgical History:  Procedure Laterality Date    . CESAREAN SECTION  02/08/2001  . GANGLION CYST EXCISION  01/11/2012   Procedure: REMOVAL GANGLION OF WRIST;  Surgeon: Tami Ribas, MD;  Location: [facility];  Service: Orthopedics;  Laterality: Right;  right wrist excision mass     Family History  Problem Relation Age of Onset  . Hyperlipidemia Mother   . Heart attack Mother   . Migraines Mother   . Lung cancer Father   . Diabetes type II Sister   . Migraines Sister   . Diabetes type I Daughter     Social History   Socioeconomic History  . Marital status: Legally Separated    Spouse name: Not on file  . Number of children: 3  . Years of education: Not on file  . Highest education level: Not on file  Occupational History  . Not on file  Tobacco Use  . Smoking status: Never Smoker  . Smokeless tobacco: Never Used  Substance and Sexual Activity  . Alcohol use: No  . Drug use: No  . Sexual activity: Yes    Birth control/protection: Post-menopausal  Other Topics Concern  . Not on file  Social History Narrative  . Not on file   Social Determinants of Health   Financial Resource Strain:   . Difficulty of Paying Living Expenses:   Food Insecurity:   . Worried About Programme researcher, broadcasting/film/video in the Last Year:   . Barista in  the Last Year:   Transportation Needs:   . Film/video editor (Medical):   Marland Kitchen Lack of Transportation (Non-Medical):   Physical Activity:   . Days of Exercise per Week:   . Minutes of Exercise per Session:   Stress:   . Feeling of Stress :   Social Connections:   . Frequency of Communication with Friends and Family:   . Frequency of Social Gatherings with Friends and Family:   . Attends Religious Services:   . Active Member of Clubs or Organizations:   . Attends Archivist Meetings:   Marland Kitchen Marital Status:   Intimate Partner Violence:   . Fear of Current or Ex-Partner:   . Emotionally Abused:   Marland Kitchen Physically Abused:   . Sexually Abused:     Outpatient  Medications Prior to Visit  Medication Sig Dispense Refill  . albuterol (PROAIR HFA) 108 (90 Base) MCG/ACT inhaler ProAir HFA 90 mcg/actuation aerosol inhaler    . atorvastatin (LIPITOR) 20 MG tablet Take 20 mg by mouth daily.    . cyclobenzaprine (FLEXERIL) 10 MG tablet Take 10 mg by mouth at bedtime.    . meclizine (ANTIVERT) 25 MG tablet meclizine 25 mg tablet    . meloxicam (MOBIC) 7.5 MG tablet Take 7.5 mg by mouth daily.    . norethindrone-ethinyl estradiol (FEMHRT 1/5) 1-5 MG-MCG TABS Take by mouth daily.    Marland Kitchen tolterodine (DETROL LA) 2 MG 24 hr capsule Take 2 mg by mouth daily.    Marland Kitchen ALPRAZolam (XANAX) 0.25 MG tablet Take 1 tablet (0.25 mg total) by mouth at bedtime as needed. 15 tablet 0  . amitriptyline (ELAVIL) 50 MG tablet Take 1 tablet (50 mg total) by mouth at bedtime. 90 tablet 0  . metoprolol succinate (TOPROL XL) 25 MG 24 hr tablet Take 1 tablet (25 mg total) by mouth in the morning and at bedtime. 180 tablet 3  . venlafaxine XR (EFFEXOR XR) 75 MG 24 hr capsule Take 1 capsule (75 mg total) by mouth daily with breakfast. 30 capsule 0   No facility-administered medications prior to visit.    Allergies  Allergen Reactions  . Codeine Nausea And Vomiting    ROS Review of Systems  Constitutional: Negative for activity change, appetite change and fatigue.  HENT: Negative for congestion and ear pain.   Respiratory: Negative for cough, chest tightness and shortness of breath.   Cardiovascular: Negative for chest pain and palpitations.  Gastrointestinal: Negative for abdominal distention, abdominal pain and vomiting.  Endocrine: Negative for cold intolerance and heat intolerance.  Genitourinary: Negative for difficulty urinating.  Musculoskeletal: Negative for arthralgias and myalgias.  Skin: Negative for rash.  Neurological: Positive for headaches.  Psychiatric/Behavioral: Negative for confusion. The patient is nervous/anxious.       Objective:    Physical Exam    Constitutional: She is oriented to person, place, and time. She appears well-developed and well-nourished.  HENT:  Mouth/Throat: Oropharynx is clear and moist.  Eyes: Conjunctivae are normal.  Cardiovascular: Regular rhythm and normal heart sounds.  Elevated pulse  Pulmonary/Chest: Effort normal and breath sounds normal. No respiratory distress.  Abdominal: Bowel sounds are normal. There is no abdominal tenderness.  Musculoskeletal:        General: No tenderness.     Cervical back: Neck supple.  Neurological: She is alert and oriented to person, place, and time.  Skin: Skin is warm. No rash noted.  Psychiatric: She has a normal mood and affect.    BP 140/88 (  BP Location: Left Arm, Patient Position: Sitting)   Pulse (!) 108   Temp 97.7 F (36.5 C) (Temporal)   Ht 5' 1.5" (1.562 m)   Wt 122 lb 12.8 oz (55.7 kg)   SpO2 98%   BMI 22.83 kg/m  Wt Readings from Last 3 Encounters:  06/16/19 122 lb 12.8 oz (55.7 kg)  06/06/19 125 lb (56.7 kg)  05/09/19 120 lb (54.4 kg)     Health Maintenance Due  Topic Date Due  . Hepatitis C Screening  Never done  . HIV Screening  Never done  . TETANUS/TDAP  Never done  . COLONOSCOPY  Never done  . PAP SMEAR-Modifier  03/10/2019    There are no preventive care reminders to display for this patient.  Lab Results  Component Value Date   TSH 0.983 05/09/2019   Lab Results  Component Value Date   WBC 6.7 05/06/2019   HGB 13.6 05/06/2019   HCT 39.8 05/06/2019   MCV 93 05/06/2019   PLT 295 05/06/2019   Lab Results  Component Value Date   NA 143 05/09/2019   K 3.5 05/09/2019   CO2 23 05/09/2019   GLUCOSE 93 05/09/2019   BUN 10 05/09/2019   CREATININE 0.67 05/09/2019   BILITOT 0.3 05/06/2019   ALKPHOS 118 (H) 05/06/2019   AST 29 05/06/2019   ALT 34 (H) 05/06/2019   PROT 6.9 05/06/2019   ALBUMIN 4.1 05/06/2019   CALCIUM 9.3 05/09/2019   Lab Results  Component Value Date   CHOL 173 05/06/2019   Lab Results  Component Value  Date   HDL 53 05/06/2019   Lab Results  Component Value Date   LDLCALC 98 05/06/2019   Lab Results  Component Value Date   TRIG 123 05/06/2019   Lab Results  Component Value Date   CHOLHDL 3.3 05/06/2019   No results found for: HGBA1C    Assessment & Plan:   Problem List Items Addressed This Visit      Other   Palpitation - Primary    Patient continues to have elevated pulse. metoprolol increased to 50 mg daily, provided education to patient on management. Follow up in 2 weeks. Rx sent to pharmacy      Anxiety   Relevant Medications   venlafaxine XR (EFFEXOR XR) 75 MG 24 hr capsule   ALPRAZolam (XANAX) 0.25 MG tablet   Moderate recurrent major depression (HCC)    Patient is well controlled on current medication. No changes made, Rx refill sent to pharmacy. Patient follow up in 3 months.      Relevant Medications   venlafaxine XR (EFFEXOR XR) 75 MG 24 hr capsule   ALPRAZolam (XANAX) 0.25 MG tablet      Meds ordered this encounter  Medications  . venlafaxine XR (EFFEXOR XR) 75 MG 24 hr capsule    Sig: Take 1 capsule (75 mg total) by mouth daily with breakfast.    Dispense:  30 capsule    Refill:  0    Order Specific Question:   Supervising Provider    AnswerCorey Harold  . Ubrogepant (UBRELVY) 50 MG TABS    Sig: Take 50 mg by mouth as needed.    Dispense:  30 tablet    Refill:  0    Order Specific Question:   Supervising Provider    AnswerBlane Ohara [id number]  . ALPRAZolam (XANAX) 0.25 MG tablet    Sig: Take 1 tablet (0.25 mg total) by mouth  at bedtime as needed.    Dispense:  30 tablet    Refill:  0    Order Specific Question:   Supervising Provider    AnswerBlane Ohara [id number]  . metoprolol succinate (TOPROL-XL) 50 MG 24 hr tablet    Sig: Take 1 tablet (50 mg total) by mouth 2 (two) times daily. Take with or immediately following a meal.    Dispense:  90 tablet    Refill:  3    Order Specific Question:   Supervising Provider     AnswerCorey Harold    Follow-up: Return in about 1 month (around 07/16/2019) for Bp and pulse.    Daryll Drown, NP

## 2019-06-16 NOTE — Assessment & Plan Note (Signed)
Patient is well controlled on current medication. No changes made, Rx refill sent to pharmacy. Patient follow up in 3 months.

## 2019-06-17 ENCOUNTER — Other Ambulatory Visit: Payer: Self-pay | Admitting: Nurse Practitioner

## 2019-06-17 MED ORDER — METOPROLOL SUCCINATE ER 50 MG PO TB24: 50.0000 mg | ORAL_TABLET | Freq: Two times a day (BID) | ORAL | 0 refills | Status: DC

## 2019-06-30 NOTE — Addendum Note (Signed)
Addended by: Daryll Drown on: 06/30/2019 04:47 PM   Modules accepted: Level of Service

## 2019-07-08 ENCOUNTER — Other Ambulatory Visit: Payer: Self-pay

## 2019-07-08 ENCOUNTER — Encounter: Payer: Self-pay | Admitting: Cardiology

## 2019-07-08 ENCOUNTER — Ambulatory Visit (INDEPENDENT_AMBULATORY_CARE_PROVIDER_SITE_OTHER): Payer: BC Managed Care – PPO | Admitting: Cardiology

## 2019-07-08 VITALS — BP 132/84 | HR 88 | Temp 98.1°F | Ht 61.0 in | Wt 120.4 lb

## 2019-07-08 DIAGNOSIS — E782 Mixed hyperlipidemia: Secondary | ICD-10-CM

## 2019-07-08 DIAGNOSIS — R002 Palpitations: Secondary | ICD-10-CM

## 2019-07-08 NOTE — Progress Notes (Signed)
Cardiology Office Note:    Date:  07/08/2019   ID:  Kimberly David, DOB [birth year], MRN [id number]  PCP:  Marge Duncans, PA-C  Cardiologist:  Jenean Lindau, MD   Referring MD: Ivy Lynn, NP    ASSESSMENT:    1. Mixed hyperlipidemia   2. Palpitations    PLAN:    In order of problems listed above:  1. Primary prevention stressed with the patient.  Importance of compliance with diet and medication stressed and he vocalized understanding. 2. Essential hypertension: Blood pressure is stable 3. Palpitations: These have resolved and she is taking beta-blockers appropriately. 4. Mixed dyslipidemia: Lipids were reviewed.  I found them on the KPN sheet and discussed with the patient at length and she is doing well with diet and therapy. 5. I stressed the importance of exercise walking at least half an hour a day 5 days a week and she promises to do so. 6. Patient will be seen in follow-up appointment in 6 months or earlier if the patient has any concerns    Medication Adjustments/Labs and Tests Ordered: Current medicines are reviewed at length with the patient today.  Concerns regarding medicines are outlined above.  No orders of the defined types were placed in this encounter.  No orders of the defined types were placed in this encounter.    No chief complaint on file.    History of Present Illness:    Kimberly David is a 59 y.o. female.  Patient has past medical history of palpitations and mixed dyslipidemia.  She denies any problems at this time and takes care of activities of daily living.  No chest pain orthopnea or PND.  Overall she leads a sedentary lifestyle.  At the time of my evaluation, the patient is alert awake oriented and in no distress.  Past Medical History:  Diagnosis Date  . Anxiety   . Cardiac murmur 05/09/2019  . COVID-19   . Depression   . Depression, major, single episode, moderate ([place]) 05/06/2019  . Ganglion cyst of wrist    Rt   . Hyperlipidemia    takes lipitor  . Mixed hyperlipidemia 05/06/2019  . Moderate recurrent major depression ([place]) 06/16/2019  . Palpitation 05/06/2019  . Palpitations 05/09/2019    Past Surgical History:  Procedure Laterality Date  . CESAREAN SECTION  02/08/2001  . GANGLION CYST EXCISION  01/11/2012   Procedure: REMOVAL GANGLION OF WRIST;  Surgeon: Tennis Must, MD;  Location: [facility];  Service: Orthopedics;  Laterality: Right;  right wrist excision mass     Current Medications: Current Meds  Medication Sig  . albuterol (PROAIR HFA) 108 (90 Base) MCG/ACT inhaler ProAir HFA 90 mcg/actuation aerosol inhaler  . ALPRAZolam (XANAX) 0.25 MG tablet Take 1 tablet (0.25 mg total) by mouth at bedtime as needed.  Marland Kitchen atorvastatin (LIPITOR) 20 MG tablet Take 20 mg by mouth daily.  . cyclobenzaprine (FLEXERIL) 10 MG tablet Take 10 mg by mouth at bedtime.  . meclizine (ANTIVERT) 25 MG tablet meclizine 25 mg tablet  . metoprolol succinate (TOPROL-XL) 50 MG 24 hr tablet Take 1 tablet (50 mg total) by mouth 2 (two) times daily. Take with or immediately following a meal.  . norethindrone-ethinyl estradiol (FEMHRT 1/5) 1-5 MG-MCG TABS Take by mouth daily.  Marland Kitchen tolterodine (DETROL LA) 2 MG 24 hr capsule Take 2 mg by mouth daily.  Marland Kitchen Ubrogepant (UBRELVY) 50 MG TABS Take 50 mg by mouth as needed.  . venlafaxine XR (  EFFEXOR XR) 75 MG 24 hr capsule Take 1 capsule (75 mg total) by mouth daily with breakfast.     Allergies:   Codeine   Social History   Socioeconomic History  . Marital status: Legally Separated    Spouse name: Not on file  . Number of children: 3  . Years of education: Not on file  . Highest education level: Not on file  Occupational History  . Not on file  Tobacco Use  . Smoking status: Never Smoker  . Smokeless tobacco: Never Used  Substance and Sexual Activity  . Alcohol use: No  . Drug use: No  . Sexual activity: Yes    Birth control/protection: Post-menopausal   Other Topics Concern  . Not on file  Social History Narrative  . Not on file   Social Determinants of Health   Financial Resource Strain:   . Difficulty of Paying Living Expenses:   Food Insecurity:   . Worried About Programme researcher, broadcasting/film/video in the Last Year:   . Barista in the Last Year:   Transportation Needs:   . Freight forwarder (Medical):   Marland Kitchen Lack of Transportation (Non-Medical):   Physical Activity:   . Days of Exercise per Week:   . Minutes of Exercise per Session:   Stress:   . Feeling of Stress :   Social Connections:   . Frequency of Communication with Friends and Family:   . Frequency of Social Gatherings with Friends and Family:   . Attends Religious Services:   . Active Member of Clubs or Organizations:   . Attends Banker Meetings:   Marland Kitchen Marital Status:      Family History: The patient's family history includes Diabetes type I in her daughter; Diabetes type II in her sister; Heart attack in her mother; Hyperlipidemia in her mother; Lung cancer in her father; Migraines in her mother and sister.  ROS:   Please see the history of present illness.    All other systems reviewed and are negative.  EKGs/Labs/Other Studies Reviewed:    The following studies were reviewed today: IMPRESSIONS    1. Technically difficult study - pt tachycardic.  2. Left ventricular ejection fraction, by estimation, is 60 to 65%. The  left ventricle has normal function. The left ventricle has no regional  wall motion abnormalities. Left ventricular diastolic parameters are  consistent with Grade I diastolic  dysfunction (impaired relaxation).  3. Right ventricular systolic function is normal. The right ventricular  size is normal. There is normal pulmonary artery systolic pressure.  4. The mitral valve is normal in structure and function. No evidence of  mitral valve regurgitation. No evidence of mitral stenosis.  5. The aortic valve is normal in  structure and function. Aortic valve  regurgitation is mild. No aortic stenosis is present.  6. The inferior vena cava is normal in size with greater than 50%  respiratory variability, suggesting right atrial pressure of 3 mmHg.    Recent Labs: 05/06/2019: ALT 34; Hemoglobin 13.6; Platelets 295 05/09/2019: BUN 10; Creatinine, Ser 0.67; Potassium 3.5; Sodium 143; TSH 0.983  Recent Lipid Panel    Component Value Date/Time   CHOL 173 05/06/2019 0955   TRIG 123 05/06/2019 0955   HDL 53 05/06/2019 0955   CHOLHDL 3.3 05/06/2019 0955   LDLCALC 98 05/06/2019 0955    Physical Exam:    VS:  BP 132/84   Pulse 88   Temp 98.1 F (36.7 C)  Ht 5\' 1"  (1.549 m)   Wt 120 lb 6.4 oz (54.6 kg)   SpO2 96%   BMI 22.75 kg/m     Wt Readings from Last 3 Encounters:  07/08/19 120 lb 6.4 oz (54.6 kg)  06/16/19 122 lb 12.8 oz (55.7 kg)  06/06/19 125 lb (56.7 kg)     GEN: Patient is in no acute distress HEENT: Normal NECK: No JVD; No carotid bruits LYMPHATICS: No lymphadenopathy CARDIAC: Hear sounds regular, 2/6 systolic murmur at the apex. RESPIRATORY:  Clear to auscultation without rales, wheezing or rhonchi  ABDOMEN: Soft, non-tender, non-distended MUSCULOSKELETAL:  No edema; No deformity  SKIN: Warm and dry NEUROLOGIC:  Alert and oriented x 3 PSYCHIATRIC:  Normal affect   Signed, 06/08/19, MD  07/08/2019 2:56 PM    [facility]

## 2019-07-08 NOTE — Patient Instructions (Signed)

## 2019-07-16 ENCOUNTER — Ambulatory Visit: Payer: BC Managed Care – PPO | Admitting: Nurse Practitioner

## 2019-07-16 ENCOUNTER — Other Ambulatory Visit: Payer: Self-pay

## 2019-07-16 ENCOUNTER — Ambulatory Visit (INDEPENDENT_AMBULATORY_CARE_PROVIDER_SITE_OTHER): Payer: BC Managed Care – PPO | Admitting: Physician Assistant

## 2019-07-16 ENCOUNTER — Encounter: Payer: Self-pay | Admitting: Physician Assistant

## 2019-07-16 VITALS — BP 128/64 | HR 91 | Temp 98.1°F | Resp 16 | Wt 120.0 lb

## 2019-07-16 DIAGNOSIS — R5383 Other fatigue: Secondary | ICD-10-CM

## 2019-07-16 DIAGNOSIS — F419 Anxiety disorder, unspecified: Secondary | ICD-10-CM

## 2019-07-16 DIAGNOSIS — R002 Palpitations: Secondary | ICD-10-CM | POA: Diagnosis not present

## 2019-07-16 HISTORY — DX: Other fatigue: R53.83

## 2019-07-16 MED ORDER — ALPRAZOLAM 0.25 MG PO TABS: 0.2500 mg | ORAL_TABLET | Freq: Every evening | ORAL | 1 refills | Status: DC | PRN

## 2019-07-16 NOTE — Assessment & Plan Note (Signed)
labwork pending Continue current meds as directed 

## 2019-07-16 NOTE — Assessment & Plan Note (Signed)
Follow up with Dr Tomie China as directed

## 2019-07-16 NOTE — Progress Notes (Signed)
Established Patient Office Visit  Subjective:  Patient ID: Kimberly David, female    DOB: 28-Jul-1960  Age: 59 y.o. MRN: [id number]  CC:  Chief Complaint  Patient presents with  . Fatigue    HPI Kimberly David presents for fatigue Pt states that she has had fatigue 'since having COVID in December' - states she is tired a lot and to a point it affects daily activity Of note pt on betablocker and being treated for palpitations labwork done in February normal but no thyroid panel done  Pt with history of palpitations - has been following with Dr Tomie China - she has had an echo which was normal but states she has never had a holter monitor She is currently taking Toprol XL50mg  2 po qhs --- has been on this over few months (of note fatigue started before this) Pt states that despite being on this medication she continues to have breakthrough palpitations and episodes where heart rate goes above 140- she is told to go ahead and make follow up appt with cardiology  Pt with history of anxiety - she is on effexor XR 75mg  qd - which works well but has run out of alprazolam and requests refill of med  Past Medical History:  Diagnosis Date  . Anxiety   . Cardiac murmur 05/09/2019  . COVID-19   . Depression   . Depression, major, single episode, moderate (HCC) 05/06/2019  . Ganglion cyst of wrist    Rt  . Hyperlipidemia    takes lipitor  . Mixed hyperlipidemia 05/06/2019  . Moderate recurrent major depression (HCC) 06/16/2019  . Palpitation 05/06/2019  . Palpitations 05/09/2019    Past Surgical History:  Procedure Laterality Date  . CESAREAN SECTION  02/08/2001  . GANGLION CYST EXCISION  01/11/2012   Procedure: REMOVAL GANGLION OF WRIST;  Surgeon: 01/13/2012, MD;  Location: [facility];  Service: Orthopedics;  Laterality: Right;  right wrist excision mass     Family History  Problem Relation Age of Onset  . Hyperlipidemia Mother   . Heart attack Mother     . Migraines Mother   . Lung cancer Father   . Diabetes type II Sister   . Migraines Sister   . Diabetes type I Daughter     Social History   Socioeconomic History  . Marital status: Legally Separated    Spouse name: Not on file  . Number of children: 3  . Years of education: Not on file  . Highest education level: Not on file  Occupational History  . Not on file  Tobacco Use  . Smoking status: Never Smoker  . Smokeless tobacco: Never Used  Substance and Sexual Activity  . Alcohol use: No  . Drug use: No  . Sexual activity: Yes    Birth control/protection: Post-menopausal  Other Topics Concern  . Not on file  Social History Narrative  . Not on file   Social Determinants of Health   Financial Resource Strain:   . Difficulty of Paying Living Expenses:   Food Insecurity:   . Worried About Tami Ribas in the Last Year:   . Programme researcher, broadcasting/film/video in the Last Year:   Transportation Needs:   . Barista (Medical):   Freight forwarder Lack of Transportation (Non-Medical):   Physical Activity:   . Days of Exercise per Week:   . Minutes of Exercise per Session:   Stress:   . Feeling of Stress :  Social Connections:   . Frequency of Communication with Friends and Family:   . Frequency of Social Gatherings with Friends and Family:   . Attends Religious Services:   . Active Member of Clubs or Organizations:   . Attends Archivist Meetings:   Marland Kitchen Marital Status:   Intimate Partner Violence:   . Fear of Current or Ex-Partner:   . Emotionally Abused:   Marland Kitchen Physically Abused:   . Sexually Abused:      Current Outpatient Medications:  .  metoprolol succinate (TOPROL-XL) 50 MG 24 hr tablet, Take 50 mg by mouth at bedtime. 2 po qhs, Disp: , Rfl:  .  albuterol (PROAIR HFA) 108 (90 Base) MCG/ACT inhaler, ProAir HFA 90 mcg/actuation aerosol inhaler, Disp: , Rfl:  .  ALPRAZolam (XANAX) 0.25 MG tablet, Take 1 tablet (0.25 mg total) by mouth at bedtime as needed., Disp: 30  tablet, Rfl: 0 .  atorvastatin (LIPITOR) 20 MG tablet, Take 20 mg by mouth daily., Disp: , Rfl:  .  cyclobenzaprine (FLEXERIL) 10 MG tablet, Take 10 mg by mouth at bedtime., Disp: , Rfl:  .  meclizine (ANTIVERT) 25 MG tablet, meclizine 25 mg tablet, Disp: , Rfl:  .  norethindrone-ethinyl estradiol (FEMHRT 1/5) 1-5 MG-MCG TABS, Take by mouth daily., Disp: , Rfl:  .  tolterodine (DETROL LA) 2 MG 24 hr capsule, Take 2 mg by mouth daily., Disp: , Rfl:  .  Ubrogepant (UBRELVY) 50 MG TABS, Take 50 mg by mouth as needed., Disp: 30 tablet, Rfl: 0 .  venlafaxine XR (EFFEXOR XR) 75 MG 24 hr capsule, Take 1 capsule (75 mg total) by mouth daily with breakfast., Disp: 30 capsule, Rfl: 0   Allergies  Allergen Reactions  . Codeine Nausea And Vomiting    ROS CONSTITUTIONAL:see HPI  CARDIOVASCULAR: Negative for chest pain, dizziness, palpitations and pedal edema.  RESPIRATORY: Negative for recent cough and dyspnea.  GASTROINTESTINAL: Negative for abdominal pain, acid reflux symptoms, constipation, diarrhea, nausea and vomiting.   NEUROLOGICAL: Negative for dizziness and headaches.  PSYCHIATRIC: see HPI       Objective:    PHYSICAL EXAM:   VS: BP 128/64   Pulse 91   Temp 98.1 F (36.7 C)   Resp 16   Wt 120 lb (54.4 kg)   SpO2 97%   BMI 22.67 kg/m   GEN: Well nourished, well developed, in no acute distress   Cardiac: RRR; no murmurs, rubs, or gallops,no edema - no significant varicosities Respiratory:  normal respiratory rate and pattern with no distress - normal breath sounds with no rales, rhonchi, wheezes or rubs  Neuro:  Alert and Oriented x 3, Strength and sensation are intact - CN II-Xii grossly intact Psych: euthymic mood, appropriate affect and demeanor  BP 128/64   Pulse 91   Temp 98.1 F (36.7 C)   Resp 16   Wt 120 lb (54.4 kg)   SpO2 97%   BMI 22.67 kg/m  Wt Readings from Last 3 Encounters:  07/16/19 120 lb (54.4 kg)  07/08/19 120 lb 6.4 oz (54.6 kg)  06/16/19 122  lb 12.8 oz (55.7 kg)     Health Maintenance Due  Topic Date Due  . Hepatitis C Screening  Never done  . HIV Screening  Never done  . TETANUS/TDAP  Never done  . COLONOSCOPY  Never done  . PAP SMEAR-Modifier  03/10/2019    There are no preventive care reminders to display for this patient.  Lab Results  Component Value  Date   TSH 0.983 05/09/2019   Lab Results  Component Value Date   WBC 6.7 05/06/2019   HGB 13.6 05/06/2019   HCT 39.8 05/06/2019   MCV 93 05/06/2019   PLT 295 05/06/2019   Lab Results  Component Value Date   NA 143 05/09/2019   K 3.5 05/09/2019   CO2 23 05/09/2019   GLUCOSE 93 05/09/2019   BUN 10 05/09/2019   CREATININE 0.67 05/09/2019   BILITOT 0.3 05/06/2019   ALKPHOS 118 (H) 05/06/2019   AST 29 05/06/2019   ALT 34 (H) 05/06/2019   PROT 6.9 05/06/2019   ALBUMIN 4.1 05/06/2019   CALCIUM 9.3 05/09/2019   Lab Results  Component Value Date   CHOL 173 05/06/2019   Lab Results  Component Value Date   HDL 53 05/06/2019   Lab Results  Component Value Date   LDLCALC 98 05/06/2019   Lab Results  Component Value Date   TRIG 123 05/06/2019   Lab Results  Component Value Date   CHOLHDL 3.3 05/06/2019   No results found for: HGBA1C    Assessment & Plan:   Problem List Items Addressed This Visit      Other   Palpitations    Follow up with Dr Tomie China as directed      Relevant Orders   CBC with Differential/Platelet   Comprehensive metabolic panel   Thyroid Panel With TSH   Other fatigue - Primary    labwork pending Continue current meds as directed      Relevant Orders   CBC with Differential/Platelet   Comprehensive metabolic panel   Thyroid Panel With TSH      No orders of the defined types were placed in this encounter.   Follow-up: Return in about 3 months (around 10/16/2019) for follow up.    SARA R Deysi Soldo, PA-C

## 2019-07-17 LAB — CBC WITH DIFFERENTIAL/PLATELET
Basophils Absolute: 0.1 10*3/uL (ref 0.0–0.2)
Basos: 1 %
EOS (ABSOLUTE): 0.4 10*3/uL (ref 0.0–0.4)
Eos: 6 %
Hematocrit: 37.5 % (ref 34.0–46.6)
Hemoglobin: 13 g/dL (ref 11.1–15.9)
Immature Grans (Abs): 0 10*3/uL (ref 0.0–0.1)
Immature Granulocytes: 0 %
Lymphocytes Absolute: 2 10*3/uL (ref 0.7–3.1)
Lymphs: 28 %
MCH: 32.7 pg (ref 26.6–33.0)
MCHC: 34.7 g/dL (ref 31.5–35.7)
MCV: 94 fL (ref 79–97)
Monocytes Absolute: 0.5 10*3/uL (ref 0.1–0.9)
Monocytes: 7 %
Neutrophils Absolute: 4.1 10*3/uL (ref 1.4–7.0)
Neutrophils: 58 %
Platelets: 296 10*3/uL (ref 150–450)
RBC: 3.98 x10E6/uL (ref 3.77–5.28)
RDW: 12.8 % (ref 11.7–15.4)
WBC: 7.1 10*3/uL (ref 3.4–10.8)

## 2019-07-17 LAB — COMPREHENSIVE METABOLIC PANEL
ALT: 11 IU/L (ref 0–32)
AST: 20 IU/L (ref 0–40)
Albumin/Globulin Ratio: 1.7 (ref 1.2–2.2)
Albumin: 4.1 g/dL (ref 3.8–4.9)
Alkaline Phosphatase: 106 IU/L (ref 39–117)
BUN/Creatinine Ratio: 32 — ABNORMAL HIGH (ref 9–23)
BUN: 26 mg/dL — ABNORMAL HIGH (ref 6–24)
Bilirubin Total: 0.3 mg/dL (ref 0.0–1.2)
CO2: 22 mmol/L (ref 20–29)
Calcium: 9.2 mg/dL (ref 8.7–10.2)
Chloride: 106 mmol/L (ref 96–106)
Creatinine, Ser: 0.82 mg/dL (ref 0.57–1.00)
GFR calc Af Amer: 91 mL/min/{1.73_m2} (ref >59)
GFR calc non Af Amer: 79 mL/min/{1.73_m2} (ref >59)
Globulin, Total: 2.4 g/dL (ref 1.5–4.5)
Glucose: 123 mg/dL — ABNORMAL HIGH (ref 65–99)
Potassium: 3.5 mmol/L (ref 3.5–5.2)
Sodium: 141 mmol/L (ref 134–144)
Total Protein: 6.5 g/dL (ref 6.0–8.5)

## 2019-07-17 LAB — THYROID PANEL WITH TSH
Free Thyroxine Index: 1.4 (ref 1.2–4.9)
T3 Uptake Ratio: 24 % (ref 24–39)
T4, Total: 6 ug/dL (ref 4.5–12.0)
TSH: 1.08 u[IU]/mL (ref 0.450–4.500)

## 2019-07-22 ENCOUNTER — Other Ambulatory Visit: Payer: Self-pay

## 2019-07-22 DIAGNOSIS — F419 Anxiety disorder, unspecified: Secondary | ICD-10-CM

## 2019-07-23 ENCOUNTER — Other Ambulatory Visit: Payer: Self-pay | Admitting: Physician Assistant

## 2019-07-23 ENCOUNTER — Other Ambulatory Visit: Payer: Self-pay

## 2019-07-23 DIAGNOSIS — F331 Major depressive disorder, recurrent, moderate: Secondary | ICD-10-CM

## 2019-07-23 MED ORDER — VENLAFAXINE HCL ER 75 MG PO CP24: 75.0000 mg | ORAL_CAPSULE | Freq: Every day | ORAL | 2 refills | Status: DC

## 2019-08-21 DIAGNOSIS — R87612 Low grade squamous intraepithelial lesion on cytologic smear of cervix (LGSIL): Secondary | ICD-10-CM | POA: Diagnosis not present

## 2019-08-21 DIAGNOSIS — Z6821 Body mass index (BMI) 21.0-21.9, adult: Secondary | ICD-10-CM | POA: Diagnosis not present

## 2019-08-25 ENCOUNTER — Other Ambulatory Visit: Payer: Self-pay | Admitting: Family Medicine

## 2019-09-16 ENCOUNTER — Other Ambulatory Visit: Payer: Self-pay | Admitting: Physician Assistant

## 2019-09-16 DIAGNOSIS — F331 Major depressive disorder, recurrent, moderate: Secondary | ICD-10-CM

## 2019-10-03 ENCOUNTER — Telehealth: Payer: Self-pay | Admitting: Cardiology

## 2019-10-03 NOTE — Telephone Encounter (Signed)
Patient called and needs a letter from Dr. Tomie China documenting her heart condition. She is trying to cancel an airline ticket and the airline needs proof of the condition before they will cancel.  The patient was supposed to leave tomorrow morning.

## 2019-10-06 ENCOUNTER — Other Ambulatory Visit: Payer: Self-pay

## 2019-10-06 ENCOUNTER — Other Ambulatory Visit: Payer: Self-pay | Admitting: Physician Assistant

## 2019-10-06 MED ORDER — MELOXICAM 15 MG PO TABS
15.0000 mg | ORAL_TABLET | Freq: Every day | ORAL | 1 refills | Status: DC
Start: 2019-10-06 — End: 2019-10-08

## 2019-10-06 NOTE — Telephone Encounter (Signed)
Left patient a detailed message letting her know that I have put this letter up front for her to pick up at her earliest convenience. I also gave her our call back number so that she could call back with any issues or concerns.

## 2019-10-06 NOTE — Telephone Encounter (Signed)
You can give her a letter mentioning her condition as my note depicts.  Thank you

## 2019-10-08 ENCOUNTER — Other Ambulatory Visit: Payer: Self-pay

## 2019-10-08 MED ORDER — MELOXICAM 15 MG PO TABS: 15.0000 mg | ORAL_TABLET | Freq: Every day | ORAL | 1 refills | Status: DC

## 2019-10-22 ENCOUNTER — Other Ambulatory Visit: Payer: Self-pay

## 2019-10-22 MED ORDER — MECLIZINE HCL 25 MG PO TABS: 25.0000 mg | ORAL_TABLET | Freq: Four times a day (QID) | ORAL | 0 refills | Status: DC | PRN

## 2019-10-27 ENCOUNTER — Ambulatory Visit (INDEPENDENT_AMBULATORY_CARE_PROVIDER_SITE_OTHER): Payer: BC Managed Care – PPO | Admitting: Physician Assistant

## 2019-10-27 ENCOUNTER — Other Ambulatory Visit: Payer: Self-pay

## 2019-10-27 ENCOUNTER — Encounter: Payer: Self-pay | Admitting: Physician Assistant

## 2019-10-27 VITALS — BP 138/82 | HR 88 | Temp 97.7°F | Ht 61.5 in | Wt 116.0 lb

## 2019-10-27 DIAGNOSIS — R002 Palpitations: Secondary | ICD-10-CM

## 2019-10-27 DIAGNOSIS — R238 Other skin changes: Secondary | ICD-10-CM

## 2019-10-27 DIAGNOSIS — R233 Spontaneous ecchymoses: Secondary | ICD-10-CM

## 2019-10-27 DIAGNOSIS — R5383 Other fatigue: Secondary | ICD-10-CM | POA: Diagnosis not present

## 2019-10-27 DIAGNOSIS — R799 Abnormal finding of blood chemistry, unspecified: Secondary | ICD-10-CM | POA: Diagnosis not present

## 2019-10-27 HISTORY — DX: Spontaneous ecchymoses: R23.3

## 2019-10-27 HISTORY — DX: Other skin changes: R23.8

## 2019-10-27 HISTORY — DX: Abnormal finding of blood chemistry, unspecified: R79.9

## 2019-10-27 MED ORDER — ATORVASTATIN CALCIUM 20 MG PO TABS: 20.0000 mg | ORAL_TABLET | Freq: Every day | ORAL | 1 refills | Status: DC

## 2019-10-27 NOTE — Assessment & Plan Note (Signed)
Refer to cardiology as previously recommended

## 2019-10-27 NOTE — Assessment & Plan Note (Signed)
Refer back to cardiology as previously recommended

## 2019-10-27 NOTE — Assessment & Plan Note (Signed)
cmp pending 

## 2019-10-27 NOTE — Assessment & Plan Note (Signed)
labwork pending 

## 2019-10-27 NOTE — Progress Notes (Signed)
Established Patient Office Visit  Subjective:  Patient ID: Kimberly David, female    DOB: 02-07-1961  Age: 59 y.o. MRN: [id number]  CC:  Chief Complaint  Patient presents with   3 month follow up    HPI Kimberly David presents for follow up of fatigue Pt states that she has had fatigue 'since having COVID in December' - states she is tired a lot and to a point it affects daily activity Of note pt on betablocker and being treated for palpitations Her labwork in May was normal including thyroid panel  Pt with history of palpitations - has been following with Dr Tomie China - she has had an echo which was normal but states she has never had a holter monitor She is currently taking Toprol XL50mg  2 po qhs --- has been on this over few months (of note fatigue started before this) Pt states that despite being on this medication she continues to have breakthrough palpitations and episodes where heart rate goes above 140- she is told to go ahead and make follow up appt with cardiology and she agreed at last visit but never made appt and states she would prefer [place] to do so now  Pt with history of anxiety - she is on effexor XR 75mg  qd and alprazolam - working well for her  Pt states that she has noted over the past several weeks that she has had some easy bruising - cannot recall injury or trauma  Past Medical History:  Diagnosis Date   Anxiety    Cardiac murmur 05/09/2019   COVID-19    Depression    Depression, major, single episode, moderate (HCC) 05/06/2019   Ganglion cyst of wrist    Rt   Hyperlipidemia    takes lipitor   Mixed hyperlipidemia 05/06/2019   Moderate recurrent major depression (HCC) 06/16/2019   Palpitation 05/06/2019   Palpitations 05/09/2019    Past Surgical History:  Procedure Laterality Date   CESAREAN SECTION  02/08/2001   GANGLION CYST EXCISION  01/11/2012   Procedure: REMOVAL GANGLION OF WRIST;  Surgeon: 01/13/2012, MD;  Location:  [facility];  Service: Orthopedics;  Laterality: Right;  right wrist excision mass     Family History  Problem Relation Age of Onset   Hyperlipidemia Mother    Heart attack Mother    Migraines Mother    Lung cancer Father    Diabetes type II Sister    Migraines Sister    Diabetes type I Daughter     Social History   Socioeconomic History   Marital status: Legally Separated    Spouse name: Not on file   Number of children: 3   Years of education: Not on file   Highest education level: Not on file  Occupational History   Not on file  Tobacco Use   Smoking status: Never Smoker   Smokeless tobacco: Never Used  Vaping Use   Vaping Use: Never used  Substance and Sexual Activity   Alcohol use: No   Drug use: No   Sexual activity: Yes    Birth control/protection: Post-menopausal  Other Topics Concern   Not on file  Social History Narrative   Not on file   Social Determinants of Health   Financial Resource Strain:    Difficulty of Paying Living Expenses:   Food Insecurity:    Worried About Running Out of Food in the Last Year:    Ran Out of Food in the Last  Year:   Transportation Needs:    Freight forwarder (Medical):    Lack of Transportation (Non-Medical):   Physical Activity:    Days of Exercise per Week:    Minutes of Exercise per Session:   Stress:    Feeling of Stress :   Social Connections:    Frequency of Communication with Friends and Family:    Frequency of Social Gatherings with Friends and Family:    Attends Religious Services:    Active Member of Clubs or Organizations:    Attends Engineer, structural:    Marital Status:   Intimate Partner Violence:    Fear of Current or Ex-Partner:    Emotionally Abused:    Physically Abused:    Sexually Abused:      Current Outpatient Medications:    albuterol (PROAIR HFA) 108 (90 Base) MCG/ACT inhaler, ProAir HFA 90 mcg/actuation aerosol  inhaler, Disp: , Rfl:    ALPRAZolam (XANAX) 0.25 MG tablet, Take 1 tablet (0.25 mg total) by mouth at bedtime as needed., Disp: 30 tablet, Rfl: 1   amitriptyline (ELAVIL) 50 MG tablet, TAKE 1 TABLET BY MOUTH EVERYDAY AT BEDTIME, Disp: 90 tablet, Rfl: 0   atorvastatin (LIPITOR) 20 MG tablet, Take 1 tablet (20 mg total) by mouth daily., Disp: 90 tablet, Rfl: 1   cyclobenzaprine (FLEXERIL) 10 MG tablet, Take 10 mg by mouth at bedtime., Disp: , Rfl:    meclizine (ANTIVERT) 25 MG tablet, Take 1 tablet (25 mg total) by mouth every 6 (six) hours as needed for dizziness., Disp: 30 tablet, Rfl: 0   meloxicam (MOBIC) 15 MG tablet, Take 1 tablet (15 mg total) by mouth daily., Disp: 30 tablet, Rfl: 1   metoprolol succinate (TOPROL-XL) 50 MG 24 hr tablet, Take 50 mg by mouth at bedtime. , Disp: , Rfl:    norethindrone-ethinyl estradiol (FEMHRT 1/5) 1-5 MG-MCG TABS, Take by mouth daily., Disp: , Rfl:    tolterodine (DETROL LA) 2 MG 24 hr capsule, Take 2 mg by mouth daily., Disp: , Rfl:    Ubrogepant (UBRELVY) 50 MG TABS, Take 50 mg by mouth as needed., Disp: 30 tablet, Rfl: 0   venlafaxine XR (EFFEXOR-XR) 75 MG 24 hr capsule, TAKE 1 CAPSULE (75 MG TOTAL) BY MOUTH DAILY WITH BREAKFAST., Disp: 90 capsule, Rfl: 1   Allergies  Allergen Reactions   Codeine Nausea And Vomiting    ROS CONSTITUTIONAL:see HPI  CARDIOVASCULAR: see HPI RESPIRATORY: Negative for recent cough and dyspnea.  GASTROINTESTINAL: Negative for abdominal pain, acid reflux symptoms, constipation, diarrhea, nausea and vomiting.  Skin - see HPI NEUROLOGICAL: Negative for dizziness and headaches.  PSYCHIATRIC: see HPI       Objective:    PHYSICAL EXAM:   VS: BP 138/82 (BP Location: Left Arm, Patient Position: Sitting)    Pulse 88    Temp 97.7 F (36.5 C) (Temporal)    Ht 5' 1.5" (1.562 m)    Wt 116 lb (52.6 kg)    SpO2 97%    BMI 21.56 kg/m   GEN: Well nourished, well developed, in no acute distress   Cardiac: RRR; no  murmurs, rubs, or gallops,no edema - no significant varicosities Respiratory:  normal respiratory rate and pattern with no distress - normal breath sounds with no rales, rhonchi, wheezes or rubs Skin - few bruises noted on upper arms Neuro:  Alert and Oriented x 3, Strength and sensation are intact - CN II-Xii grossly intact Psych: euthymic mood, appropriate affect and demeanor  BP  138/82 (BP Location: Left Arm, Patient Position: Sitting)    Pulse 88    Temp 97.7 F (36.5 C) (Temporal)    Ht 5' 1.5" (1.562 m)    Wt 116 lb (52.6 kg)    SpO2 97%    BMI 21.56 kg/m  Wt Readings from Last 3 Encounters:  10/27/19 116 lb (52.6 kg)  07/16/19 120 lb (54.4 kg)  07/08/19 120 lb 6.4 oz (54.6 kg)     Health Maintenance Due  Topic Date Due   Hepatitis C Screening  Never done   HIV Screening  Never done   TETANUS/TDAP  Never done   COLONOSCOPY  Never done   PAP SMEAR-Modifier  03/10/2019   INFLUENZA VACCINE  10/12/2019    There are no preventive care reminders to display for this patient.  Lab Results  Component Value Date   TSH 1.080 07/16/2019   Lab Results  Component Value Date   WBC 7.1 07/16/2019   HGB 13.0 07/16/2019   HCT 37.5 07/16/2019   MCV 94 07/16/2019   PLT 296 07/16/2019   Lab Results  Component Value Date   NA 141 07/16/2019   K 3.5 07/16/2019   CO2 22 07/16/2019   GLUCOSE 123 (H) 07/16/2019   BUN 26 (H) 07/16/2019   CREATININE 0.82 07/16/2019   BILITOT 0.3 07/16/2019   ALKPHOS 106 07/16/2019   AST 20 07/16/2019   ALT 11 07/16/2019   PROT 6.5 07/16/2019   ALBUMIN 4.1 07/16/2019   CALCIUM 9.2 07/16/2019   Lab Results  Component Value Date   CHOL 173 05/06/2019   Lab Results  Component Value Date   HDL 53 05/06/2019   Lab Results  Component Value Date   LDLCALC 98 05/06/2019   Lab Results  Component Value Date   TRIG 123 05/06/2019   Lab Results  Component Value Date   CHOLHDL 3.3 05/06/2019   No results found for: HGBA1C     Assessment & Plan:   Problem List Items Addressed This Visit      Other   Palpitation    Refer back to cardiology as previously recommended      Relevant Orders   Ambulatory referral to Cardiology   Other fatigue    Refer to cardiology as previously recommended      Easy bruising - Primary    labwork pending      Relevant Orders   CBC with Differential/Platelet   Protime-INR   Abnormal blood chemistry    cmp pending      Relevant Orders   Comprehensive metabolic panel      Meds ordered this encounter  Medications   atorvastatin (LIPITOR) 20 MG tablet    Sig: Take 1 tablet (20 mg total) by mouth daily.    Dispense:  90 tablet    Refill:  1    Order Specific Question:   Supervising Provider    Answer:   Corey Harold    Follow-up: Return in about 3 months (around 01/27/2020) for chronic fasting follow up.    SARA R Cadyn Fann, PA-C

## 2019-10-28 LAB — CBC WITH DIFFERENTIAL/PLATELET
Basophils Absolute: 0.1 10*3/uL (ref 0.0–0.2)
Basos: 1 %
EOS (ABSOLUTE): 0.3 10*3/uL (ref 0.0–0.4)
Eos: 4 %
Hematocrit: 38.4 % (ref 34.0–46.6)
Hemoglobin: 13.4 g/dL (ref 11.1–15.9)
Immature Grans (Abs): 0 10*3/uL (ref 0.0–0.1)
Immature Granulocytes: 0 %
Lymphocytes Absolute: 2 10*3/uL (ref 0.7–3.1)
Lymphs: 26 %
MCH: 32.5 pg (ref 26.6–33.0)
MCHC: 34.9 g/dL (ref 31.5–35.7)
MCV: 93 fL (ref 79–97)
Monocytes Absolute: 0.5 10*3/uL (ref 0.1–0.9)
Monocytes: 7 %
Neutrophils Absolute: 4.8 10*3/uL (ref 1.4–7.0)
Neutrophils: 62 %
Platelets: 303 10*3/uL (ref 150–450)
RBC: 4.12 x10E6/uL (ref 3.77–5.28)
RDW: 13 % (ref 11.7–15.4)
WBC: 7.8 10*3/uL (ref 3.4–10.8)

## 2019-10-28 LAB — COMPREHENSIVE METABOLIC PANEL
ALT: 13 IU/L (ref 0–32)
AST: 17 IU/L (ref 0–40)
Albumin/Globulin Ratio: 1.7 (ref 1.2–2.2)
Albumin: 4.3 g/dL (ref 3.8–4.9)
Alkaline Phosphatase: 110 IU/L (ref 48–121)
BUN/Creatinine Ratio: 22 (ref 9–23)
BUN: 16 mg/dL (ref 6–24)
Bilirubin Total: 0.2 mg/dL (ref 0.0–1.2)
CO2: 25 mmol/L (ref 20–29)
Calcium: 9.4 mg/dL (ref 8.7–10.2)
Chloride: 104 mmol/L (ref 96–106)
Creatinine, Ser: 0.72 mg/dL (ref 0.57–1.00)
GFR calc Af Amer: 106 mL/min/{1.73_m2} (ref >59)
GFR calc non Af Amer: 92 mL/min/{1.73_m2} (ref >59)
Globulin, Total: 2.5 g/dL (ref 1.5–4.5)
Glucose: 111 mg/dL — ABNORMAL HIGH (ref 65–99)
Potassium: 4.1 mmol/L (ref 3.5–5.2)
Sodium: 141 mmol/L (ref 134–144)
Total Protein: 6.8 g/dL (ref 6.0–8.5)

## 2019-10-28 LAB — PROTIME-INR
INR: 0.9 (ref 0.9–1.2)
Prothrombin Time: 9.9 s (ref 9.1–12.0)

## 2019-11-03 ENCOUNTER — Ambulatory Visit: Payer: BC Managed Care – PPO | Admitting: Nurse Practitioner

## 2019-11-05 ENCOUNTER — Encounter: Payer: Self-pay | Admitting: General Practice

## 2019-11-06 ENCOUNTER — Ambulatory Visit: Payer: BC Managed Care – PPO | Admitting: Physician Assistant

## 2019-11-11 ENCOUNTER — Other Ambulatory Visit: Payer: Self-pay | Admitting: Physician Assistant

## 2019-11-11 DIAGNOSIS — F419 Anxiety disorder, unspecified: Secondary | ICD-10-CM

## 2019-11-28 ENCOUNTER — Other Ambulatory Visit: Payer: Self-pay | Admitting: Family Medicine

## 2019-12-04 DIAGNOSIS — Z20828 Contact with and (suspected) exposure to other viral communicable diseases: Secondary | ICD-10-CM | POA: Diagnosis not present

## 2019-12-10 DIAGNOSIS — Z20828 Contact with and (suspected) exposure to other viral communicable diseases: Secondary | ICD-10-CM | POA: Diagnosis not present

## 2019-12-23 ENCOUNTER — Telehealth (INDEPENDENT_AMBULATORY_CARE_PROVIDER_SITE_OTHER): Payer: BC Managed Care – PPO | Admitting: Legal Medicine

## 2019-12-23 ENCOUNTER — Encounter: Payer: Self-pay | Admitting: Legal Medicine

## 2019-12-23 VITALS — BP 134/51 | Temp 97.8°F | Ht 61.5 in | Wt 116.0 lb

## 2019-12-23 DIAGNOSIS — H6993 Unspecified Eustachian tube disorder, bilateral: Secondary | ICD-10-CM

## 2019-12-23 DIAGNOSIS — H6503 Acute serous otitis media, bilateral: Secondary | ICD-10-CM

## 2019-12-23 DIAGNOSIS — H6990 Unspecified Eustachian tube disorder, unspecified ear: Secondary | ICD-10-CM | POA: Insufficient documentation

## 2019-12-23 DIAGNOSIS — H6690 Otitis media, unspecified, unspecified ear: Secondary | ICD-10-CM

## 2019-12-23 HISTORY — DX: Otitis media, unspecified, unspecified ear: H66.90

## 2019-12-23 HISTORY — DX: Unspecified eustachian tube disorder, unspecified ear: H69.90

## 2019-12-23 MED ORDER — PREDNISONE 10 MG PO TABS: 10.0000 mg | ORAL_TABLET | Freq: Every day | ORAL | 0 refills | Status: DC

## 2019-12-23 MED ORDER — AZITHROMYCIN 250 MG PO TABS: ORAL_TABLET | ORAL | 0 refills | Status: DC

## 2019-12-23 NOTE — Progress Notes (Signed)
Virtual Visit via Telephone Note   This visit type was conducted due to national recommendations for restrictions regarding the COVID-19 Pandemic (e.g. social distancing) in an effort to limit this patient's exposure and mitigate transmission in our community.  Due to her co-morbid illnesses, this patient is at least at moderate risk for complications without adequate follow up.  This format is felt to be most appropriate for this patient at this time.  The patient did not have access to video technology/had technical difficulties with video requiring transitioning to audio format only (telephone).  All issues noted in this document were discussed and addressed.  No physical exam could be performed with this format.  Patient verbally consented to a telehealth visit.   Date:  12/23/2019   ID:  Kimberly David, DOB 03-26-1960, MRN [id number]  Patient Location: Home Provider Location: Office/Clinic  PCP:  Bronwyn Sofia, PA-C                                   Evaluation Performed:  New Patient Evaluation  Chief Complaint:  Otalgia for 2 weeks  History of Present Illness:    Kimberly David is a 59 y.o. female with otalgia and eustation tube dysfunction  For 2 weeks, she gets stabbing pain at night.  She is congested.  The patient does not have symptoms concerning for COVID-19 infection (fever, chills, cough, or new shortness of breath).    Past Medical History:  Diagnosis Date  . Anxiety   . Cardiac murmur 05/09/2019  . COVID-19   . Depression   . Depression, major, single episode, moderate (HCC) 05/06/2019  . Ganglion cyst of wrist    Rt  . Hyperlipidemia    takes lipitor  . Mixed hyperlipidemia 05/06/2019  . Moderate recurrent major depression (HCC) 06/16/2019  . Palpitation 05/06/2019  . Palpitations 05/09/2019    Past Surgical History:  Procedure Laterality Date  . CESAREAN SECTION  02/08/2001  . GANGLION CYST EXCISION  01/11/2012    Procedure: REMOVAL GANGLION OF WRIST;  Surgeon: Tami Ribas, MD;  Location: [facility];  Service: Orthopedics;  Laterality: Right;  right wrist excision mass     Family History  Problem Relation Age of Onset  . Hyperlipidemia Mother   . Heart attack Mother   . Migraines Mother   . Lung cancer Father   . Diabetes type II Sister   . Migraines Sister   . Diabetes type I Daughter     Social History   Socioeconomic History  . Marital status: Legally Separated    Spouse name: Not on file  . Number of children: 3  . Years of education: Not on file  . Highest education level: Not on file  Occupational History  . Not on file  Tobacco Use  . Smoking status: Never Smoker  . Smokeless tobacco: Never Used  Vaping Use  . Vaping Use: Never used  Substance and Sexual Activity  . Alcohol use: No  . Drug use: No  . Sexual activity: Yes    Birth control/protection: Post-menopausal  Other Topics Concern  . Not on file  Social History Narrative  . Not on file   Social Determinants of Health   Financial Resource Strain:   . Difficulty of Paying Living Expenses: Not on file  Food Insecurity:   . Worried About Programme researcher, broadcasting/film/video in the Last Year: Not on file  .  Ran Out of Food in the Last Year: Not on file  Transportation Needs:   . Lack of Transportation (Medical): Not on file  . Lack of Transportation (Non-Medical): Not on file  Physical Activity:   . Days of Exercise per Week: Not on file  . Minutes of Exercise per Session: Not on file  Stress:   . Feeling of Stress : Not on file  Social Connections:   . Frequency of Communication with Friends and Family: Not on file  . Frequency of Social Gatherings with Friends and Family: Not on file  . Attends Religious Services: Not on file  . Active Member of Clubs or Organizations: Not on file  . Attends Banker Meetings: Not on file  . Marital Status: Not on file  Intimate Partner Violence:   . Fear of  Current or Ex-Partner: Not on file  . Emotionally Abused: Not on file  . Physically Abused: Not on file  . Sexually Abused: Not on file    Outpatient Medications Prior to Visit  Medication Sig Dispense Refill  . albuterol (PROAIR HFA) 108 (90 Base) MCG/ACT inhaler ProAir HFA 90 mcg/actuation aerosol inhaler    . ALPRAZolam (XANAX) 0.25 MG tablet TAKE 1 TABLET BY MOUTH AT BEDTIME AS NEEDED. 30 tablet 1  . amitriptyline (ELAVIL) 50 MG tablet TAKE 1 TABLET BY MOUTH EVERYDAY AT BEDTIME 90 tablet 0  . atorvastatin (LIPITOR) 20 MG tablet Take 1 tablet (20 mg total) by mouth daily. 90 tablet 1  . cyclobenzaprine (FLEXERIL) 10 MG tablet Take 10 mg by mouth at bedtime.    . meclizine (ANTIVERT) 25 MG tablet Take 1 tablet (25 mg total) by mouth every 6 (six) hours as needed for dizziness. 30 tablet 0  . meloxicam (MOBIC) 15 MG tablet Take 1 tablet (15 mg total) by mouth daily. 30 tablet 1  . metoprolol succinate (TOPROL-XL) 50 MG 24 hr tablet Take 50 mg by mouth at bedtime.     . norethindrone-ethinyl estradiol (FEMHRT 1/5) 1-5 MG-MCG TABS Take by mouth daily.    Marland Kitchen tolterodine (DETROL LA) 2 MG 24 hr capsule Take 2 mg by mouth daily.    Marland Kitchen Ubrogepant (UBRELVY) 50 MG TABS Take 50 mg by mouth as needed. 30 tablet 0  . venlafaxine XR (EFFEXOR-XR) 75 MG 24 hr capsule TAKE 1 CAPSULE (75 MG TOTAL) BY MOUTH DAILY WITH BREAKFAST. 90 capsule 1   No facility-administered medications prior to visit.    Allergies:   Codeine   Social History   Tobacco Use  . Smoking status: Never Smoker  . Smokeless tobacco: Never Used  Vaping Use  . Vaping Use: Never used  Substance Use Topics  . Alcohol use: No  . Drug use: No     Review of Systems  Constitutional: Negative for chills and fever.  HENT: Positive for ear pain and sinus pain. Negative for ear discharge and sore throat.   Eyes: Negative.   Respiratory: Negative for cough.   Cardiovascular: Positive for chest pain.  Gastrointestinal: Negative.     Genitourinary: Negative.   Musculoskeletal: Negative.   Skin: Negative.   Neurological: Negative.   Psychiatric/Behavioral: Negative.      Labs/Other Tests and Data Reviewed:    Recent Labs: 07/16/2019: TSH 1.080 10/27/2019: ALT 13; BUN 16; Creatinine, Ser 0.72; Hemoglobin 13.4; Platelets 303; Potassium 4.1; Sodium 141   Recent Lipid Panel Lab Results  Component Value Date/Time   CHOL 173 05/06/2019 09:55 AM   TRIG 123 05/06/2019  09:55 AM   HDL 53 05/06/2019 09:55 AM   CHOLHDL 3.3 05/06/2019 09:55 AM   LDLCALC 98 05/06/2019 09:55 AM    Wt Readings from Last 3 Encounters:  12/23/19 116 lb (52.6 kg)  10/27/19 116 lb (52.6 kg)  07/16/19 120 lb (54.4 kg)     Objective:    Vital Signs:  BP (!) 134/51   Temp 97.8 F (36.6 C)   Ht 5' 1.5" (1.562 m)   Wt 116 lb (52.6 kg)   BMI 21.56 kg/m    Physical Exam VS reviewed  ASSESSMENT & PLAN:   1. Non-recurrent acute serous otitis media of both ears - azithromycin (ZITHROMAX) 250 MG tablet; 2 tablets on day 1, then 1 tablet daily on days 2-6.  Dispense: 6 tablet; Refill: 0 - predniSONE (DELTASONE) 10 MG tablet; Take 1 tablet (10 mg total) by mouth daily with breakfast. Take 6ills first day , then 5 pills day 2 and then cut down one pill day until gone  Dispense: 21 tablet; Refill: 0 Start zpack and flonase nasal spray, use prednisone for congestion  2. Disorder of both eustachian tubes Use prednisone for congestion    No orders of the defined types were placed in this encounter.    Meds ordered this encounter  Medications  . azithromycin (ZITHROMAX) 250 MG tablet    Sig: 2 tablets on day 1, then 1 tablet daily on days 2-6.    Dispense:  6 tablet    Refill:  0  . predniSONE (DELTASONE) 10 MG tablet    Sig: Take 1 tablet (10 mg total) by mouth daily with breakfast. Take 6ills first day , then 5 pills day 2 and then cut down one pill day until gone    Dispense:  21 tablet    Refill:  0    COVID-19 Education: The signs  and symptoms of COVID-19 were discussed with the patient and how to seek care for testing (follow up with PCP or arrange E-visit). The importance of social distancing was discussed today.  Time:   Today, I have spent 20 minutes with the patient with telehealth technology discussing the above problems.    Follow Up:  In Person prn  Signed, Brent Bulla, MD  12/23/2019 12:06 PM    [facility]

## 2020-01-14 DIAGNOSIS — R1110 Vomiting, unspecified: Secondary | ICD-10-CM | POA: Diagnosis not present

## 2020-01-14 DIAGNOSIS — K529 Noninfective gastroenteritis and colitis, unspecified: Secondary | ICD-10-CM | POA: Diagnosis not present

## 2020-01-14 DIAGNOSIS — Z20828 Contact with and (suspected) exposure to other viral communicable diseases: Secondary | ICD-10-CM | POA: Diagnosis not present

## 2020-01-28 ENCOUNTER — Ambulatory Visit: Payer: BC Managed Care – PPO | Admitting: Family Medicine

## 2020-01-28 ENCOUNTER — Ambulatory Visit: Payer: BC Managed Care – PPO | Admitting: Physician Assistant

## 2020-01-29 DIAGNOSIS — M67439 Ganglion, unspecified wrist: Secondary | ICD-10-CM | POA: Insufficient documentation

## 2020-01-29 DIAGNOSIS — E785 Hyperlipidemia, unspecified: Secondary | ICD-10-CM | POA: Insufficient documentation

## 2020-01-29 DIAGNOSIS — U071 COVID-19: Secondary | ICD-10-CM | POA: Insufficient documentation

## 2020-01-29 DIAGNOSIS — F32A Depression, unspecified: Secondary | ICD-10-CM | POA: Insufficient documentation

## 2020-01-30 ENCOUNTER — Other Ambulatory Visit: Payer: Self-pay

## 2020-01-30 ENCOUNTER — Ambulatory Visit (INDEPENDENT_AMBULATORY_CARE_PROVIDER_SITE_OTHER): Payer: BC Managed Care – PPO | Admitting: Cardiology

## 2020-01-30 ENCOUNTER — Encounter: Payer: Self-pay | Admitting: Cardiology

## 2020-01-30 VITALS — BP 132/68 | HR 96 | Ht 61.0 in | Wt 127.0 lb

## 2020-01-30 DIAGNOSIS — I209 Angina pectoris, unspecified: Secondary | ICD-10-CM | POA: Insufficient documentation

## 2020-01-30 DIAGNOSIS — E782 Mixed hyperlipidemia: Secondary | ICD-10-CM | POA: Diagnosis not present

## 2020-01-30 DIAGNOSIS — R002 Palpitations: Secondary | ICD-10-CM | POA: Diagnosis not present

## 2020-01-30 DIAGNOSIS — I208 Other forms of angina pectoris: Secondary | ICD-10-CM | POA: Diagnosis not present

## 2020-01-30 HISTORY — DX: Angina pectoris, unspecified: I20.9

## 2020-01-30 MED ORDER — NITROGLYCERIN 0.4 MG SL SUBL: 0.4000 mg | SUBLINGUAL_TABLET | SUBLINGUAL | 6 refills | Status: DC | PRN

## 2020-01-30 MED ORDER — ASPIRIN EC 81 MG PO TBEC: 81.0000 mg | DELAYED_RELEASE_TABLET | Freq: Every day | ORAL | 3 refills | Status: AC

## 2020-01-30 NOTE — Patient Instructions (Signed)
Medication Instructions:  Your physician has recommended you make the following change in your medication:   Take nitroglycerin as needed for chest pain. Take 81 mg coated aspirin daily.  *If you need a refill on your cardiac medications before your next appointment, please call your pharmacy*   Lab Work: Your physician recommends that you return for lab work in: 1 week prior to your CT. You can come Monday through Friday 8:30 am to 12:00 pm and 1:15 to 4:30. You do not need to make an appointment as the order has already been placed. This will be a BMET.   If you have labs (blood work) drawn today and your tests are completely normal, you will receive your results only by: Marland Kitchen MyChart Message (if you have MyChart) OR . A paper copy in the mail If you have any lab test that is abnormal or we need to change your treatment, we will call you to review the results.   Testing/Procedures: Your cardiac CT will be scheduled at:   [facility] [place], [place] [address] [phone]   If scheduled at [facility], please arrive at the [facility] main entrance of [facility] 30 minutes prior to test start time. Proceed to the [facility] Radiology Department (first floor) to check-in and test prep.  Please follow these instructions carefully (unless otherwise directed):  On the Night Before the Test: . Be sure to Drink plenty of water. . Do not consume any caffeinated/decaffeinated beverages or chocolate 12 hours prior to your test. . Do not take any antihistamines 12 hours prior to your test. . If the patient has contrast allergy:  On the Day of the Test: . Drink plenty of water. Do not drink any water within one hour of the test. . Do not eat any food 4 hours prior to the test. . You may take your regular medications prior to the test.  . Take Atenolol (Tenormin) two hours prior to test. . FEMALES- please wear underwire-free bra if  available      After the Test: . Drink plenty of water. . After receiving IV contrast, you may experience a mild flushed feeling. This is normal. . On occasion, you may experience a mild rash up to 24 hours after the test. This is not dangerous. If this occurs, you can take Benadryl 25 mg and increase your fluid intake. . If you experience trouble breathing, this can be serious. If it is severe call 911 IMMEDIATELY. If it is mild, please call our office. . If you take any of these medications: Glipizide/Metformin, Avandament, Glucavance, please do not take 48 hours after completing test unless otherwise instructed.   Once we have confirmed authorization from your insurance company, we will call you to set up a date and time for your test. Based on how quickly your insurance processes prior authorizations requests, please allow up to 4 weeks to be contacted for scheduling your Cardiac CT appointment. Be advised that routine Cardiac CT appointments could be scheduled as many as 8 weeks after your provider has ordered it.  For non-scheduling related questions, please contact the cardiac imaging nurse navigator should you have any questions/concerns: Marchia Bond, Cardiac Imaging Nurse Navigator Burley Saver, Interim Cardiac Imaging Nurse [facility] Direct Office Dial: [phone]   For scheduling needs, including cancellations and rescheduling, please call Vivien Rota at [phone].     Follow-Up: At [facility], you and your health  needs are our priority.  As part of our continuing mission to provide you with exceptional heart care, we have created designated Provider Care Teams.  These Care Teams include your primary Cardiologist (physician) and Advanced Practice Providers (APPs -  Physician Assistants and Nurse Practitioners) who all work together to provide you with the care you need, when you need it.  We recommend signing up for the patient portal called  "MyChart".  Sign up information is provided on this After Visit Summary.  MyChart is used to connect with patients for Virtual Visits (Telemedicine).  Patients are able to view lab/test results, encounter notes, upcoming appointments, etc.  Non-urgent messages can be sent to your provider as well.   To learn more about what you can do with MyChart, go to [email or web address].    Your next appointment:   2 month(s)  The format for your next appointment:   In Person  Provider:   Jyl Heinz, MD   Other Instructions Cardiac CT Angiogram A cardiac CT angiogram is a procedure to look at the heart and the area around the heart. It may be done to help find the cause of chest pains or other symptoms of heart disease. During this procedure, a substance called contrast dye is injected into the blood vessels in the area to be checked. A large X-ray machine, called a CT scanner, then takes detailed pictures of the heart and the surrounding area. The procedure is also sometimes called a coronary CT angiogram, coronary artery scanning, or CTA. A cardiac CT angiogram allows the health care provider to see how well blood is flowing to and from the heart. The health care provider will be able to see if there are any problems, such as:  Blockage or narrowing of the coronary arteries in the heart.  Fluid around the heart.  Signs of weakness or disease in the muscles, valves, and tissues of the heart. Tell a health care provider about:  Any allergies you have. This is especially important if you have had a previous allergic reaction to contrast dye.  All medicines you are taking, including vitamins, herbs, eye drops, creams, and over-the-counter medicines.  Any blood disorders you have.  Any surgeries you have had.  Any medical conditions you have.  Whether you are pregnant or may be pregnant.  Any anxiety disorders, chronic pain, or other conditions you have that may increase your stress or  prevent you from lying still. What are the risks? Generally, this is a safe procedure. However, problems may occur, including: 1. Bleeding. 2. Infection. 3. Allergic reactions to medicines or dyes. 4. Damage to other structures or organs. 5. Kidney damage from the contrast dye that is used. 6. Increased risk of cancer from radiation exposure. This risk is low. Talk with your health care provider about: ? The risks and benefits of testing. ? How you can receive the lowest dose of radiation. What happens before the procedure? 1. Wear comfortable clothing and remove any jewelry, glasses, dentures, and hearing aids. 2. Follow instructions from your health care provider about eating and drinking. This may include: ? For 12 hours before the procedure -- avoid caffeine. This includes tea, coffee, soda, energy drinks, and diet pills. Drink plenty of water or other fluids that do not have caffeine in them. Being well hydrated can prevent complications. ? For 4-6 hours before the procedure -- stop eating and drinking. The contrast dye can cause nausea, but this is less likely if your stomach is empty.  3. Ask your health care provider about changing or stopping your regular medicines. This is especially important if you are taking diabetes medicines, blood thinners, or medicines to treat problems with erections (erectile dysfunction). What happens during the procedure?  1. Hair on your chest may need to be removed so that small sticky patches called electrodes can be placed on your chest. These will transmit information that helps to monitor your heart during the procedure. 2. An IV will be inserted into one of your veins. 3. You might be given a medicine to control your heart rate during the procedure. This will help to ensure that good images are obtained. 4. You will be asked to lie on an exam table. This table will slide in and out of the CT machine during the procedure. 5. Contrast dye will be  injected into the IV. You might feel warm, or you may get a metallic taste in your mouth. 6. You will be given a medicine called nitroglycerin. This will relax or dilate the arteries in your heart. 7. The table that you are lying on will move into the CT machine tunnel for the scan. 8. The person running the machine will give you instructions while the scans are being done. You may be asked to: ? Keep your arms above your head. ? Hold your breath. ? Stay very still, even if the table is moving. 9. When the scanning is complete, you will be moved out of the machine. 10. The IV will be removed. The procedure may vary among health care providers and hospitals. What can I expect after the procedure? After your procedure, it is common to have:  A metallic taste in your mouth from the contrast dye.  A feeling of warmth.  A headache from the nitroglycerin. Follow these instructions at home:  Take over-the-counter and prescription medicines only as told by your health care provider.  If you are told, drink enough fluid to keep your urine pale yellow. This will help to flush the contrast dye out of your body.  Most people can return to their normal activities right after the procedure. Ask your health care provider what activities are safe for you.  It is up to you to get the results of your procedure. Ask your health care provider, or the department that is doing the procedure, when your results will be ready.  Keep all follow-up visits as told by your health care provider. This is important. Contact a health care provider if: 1. You have any symptoms of allergy to the contrast dye. These include: ? Shortness of breath. ? Rash or hives. ? A racing heartbeat. Summary  A cardiac CT angiogram is a procedure to look at the heart and the area around the heart. It may be done to help find the cause of chest pains or other symptoms of heart disease.  During this procedure, a large X-ray  machine, called a CT scanner, takes detailed pictures of the heart and the surrounding area after a contrast dye has been injected into blood vessels in the area.  Ask your health care provider about changing or stopping your regular medicines before the procedure. This is especially important if you are taking diabetes medicines, blood thinners, or medicines to treat erectile dysfunction.  If you are told, drink enough fluid to keep your urine pale yellow. This will help to flush the contrast dye out of your body. This information is not intended to replace advice given to you by  your health care provider. Make sure you discuss any questions you have with your health care provider. Document Revised: 10/23/2018 Document Reviewed: 10/23/2018 Elsevier Patient Education  [place].  Nitroglycerin sublingual tablets What is this medicine? NITROGLYCERIN (nye troe GLI ser in) is a type of vasodilator. It relaxes blood vessels, increasing the blood and oxygen supply to your heart. This medicine is used to relieve chest pain caused by angina. It is also used to prevent chest pain before activities like climbing stairs, going outdoors in cold weather, or sexual activity. This medicine may be used for other purposes; ask your health care provider or pharmacist if you have questions. COMMON BRAND NAME(S): Nitroquick, Nitrostat, Nitrotab What should I tell my health care provider before I take this medicine? They need to know if you have any of these conditions:  anemia  head injury, recent stroke, or bleeding in the brain  liver disease  previous heart attack  an unusual or allergic reaction to nitroglycerin, other medicines, foods, dyes, or preservatives  pregnant or trying to get pregnant  breast-feeding How should I use this medicine? Take this medicine by mouth as needed. At the first sign of an angina attack (chest pain or tightness) place one tablet under your tongue. You can also  take this medicine 5 to 10 minutes before an event likely to produce chest pain. Follow the directions on the prescription label. Let the tablet dissolve under the tongue. Do not swallow whole. Replace the dose if you accidentally swallow it. It will help if your mouth is not dry. Saliva around the tablet will help it to dissolve more quickly. Do not eat or drink, smoke or chew tobacco while a tablet is dissolving. If you are not better within 5 minutes after taking ONE dose of nitroglycerin, call 9-1-1 immediately to seek emergency medical care. Do not take more than 3 nitroglycerin tablets over 15 minutes. If you take this medicine often to relieve symptoms of angina, your doctor or health care professional may provide you with different instructions to manage your symptoms. If symptoms do not go away after following these instructions, it is important to call 9-1-1 immediately. Do not take more than 3 nitroglycerin tablets over 15 minutes. Talk to your pediatrician regarding the use of this medicine in children. Special care may be needed. Overdosage: If you think you have taken too much of this medicine contact a poison control center or emergency room at once. NOTE: This medicine is only for you. Do not share this medicine with others. What if I miss a dose? This does not apply. This medicine is only used as needed. What may interact with this medicine? Do not take this medicine with any of the following medications:  certain migraine medicines like ergotamine and dihydroergotamine (DHE)  medicines used to treat erectile dysfunction like sildenafil, tadalafil, and vardenafil  riociguat This medicine may also interact with the following medications:  alteplase  aspirin  heparin  medicines for high blood pressure  medicines for mental depression  other medicines used to treat angina  phenothiazines like chlorpromazine, mesoridazine, prochlorperazine, thioridazine This list may not  describe all possible interactions. Give your health care provider a list of all the medicines, herbs, non-prescription drugs, or dietary supplements you use. Also tell them if you smoke, drink alcohol, or use illegal drugs. Some items may interact with your medicine. What should I watch for while using this medicine? Tell your doctor or health care professional if you feel your  medicine is no longer working. Keep this medicine with you at all times. Sit or lie down when you take your medicine to prevent falling if you feel dizzy or faint after using it. Try to remain calm. This will help you to feel better faster. If you feel dizzy, take several deep breaths and lie down with your feet propped up, or bend forward with your head resting between your knees. You may get drowsy or dizzy. Do not drive, use machinery, or do anything that needs mental alertness until you know how this drug affects you. Do not stand or sit up quickly, especially if you are an older patient. This reduces the risk of dizzy or fainting spells. Alcohol can make you more drowsy and dizzy. Avoid alcoholic drinks. Do not treat yourself for coughs, colds, or pain while you are taking this medicine without asking your doctor or health care professional for advice. Some ingredients may increase your blood pressure. What side effects may I notice from receiving this medicine? Side effects that you should report to your doctor or health care professional as soon as possible:  blurred vision  dry mouth  skin rash  sweating  the feeling of extreme pressure in the head  unusually weak or tired Side effects that usually do not require medical attention (report to your doctor or health care professional if they continue or are bothersome):  flushing of the face or neck  headache  irregular heartbeat, palpitations  nausea, vomiting This list may not describe all possible side effects. Call your doctor for medical advice about  side effects. You may report side effects to FDA at 1-800-FDA-1088. Where should I keep my medicine? Keep out of the reach of children. Store at room temperature between 20 and 25 degrees C (68 and 77 degrees F). Store in [employer]. Protect from light and moisture. Keep tightly closed. Throw away any unused medicine after the expiration date. NOTE: This sheet is a summary. It may not cover all possible information. If you have questions about this medicine, talk to your doctor, pharmacist, or health care provider.  2020 Elsevier/Gold Standard (2012-12-26 17:57:36)   Aspirin and Your Heart  Aspirin is a medicine that prevents the cells in the blood that are used for clotting, called platelets, from sticking together. Aspirin can be used to help reduce the risk of blood clots, heart attacks, and other heart-related problems. Can I take aspirin? Your health care provider will help you determine whether it is safe and beneficial for you to take aspirin daily. Taking aspirin daily may be helpful if you:  Have had a heart attack or chest pain.  Are at risk for a heart attack.  Have undergone open-heart surgery, such as coronary artery bypass surgery (CABG).  Have had coronary angioplasty or a stent.  Have had certain types of stroke or transient ischemic attack (TIA).  Have peripheral artery disease (PAD).  Have chronic heart rhythm problems such as atrial fibrillation and cannot take an anticoagulant.  Have valve disease or have had surgery on a valve. What are the risks? Daily use of aspirin can cause side effects. Some of these include:  Bleeding. Bleeding problems can be minor or serious. An example of a minor problem is a cut that does not stop bleeding. An example of a more serious problem is stomach bleeding or, rarely, bleeding into the brain. Your risk of bleeding is increased if you are also taking non-steroidal anti-inflammatory drugs (NSAIDs).  Increased  bruising.  Upset stomach.  An allergic reaction. People who have nasal polyps have an increased risk of developing an aspirin allergy. General guidelines  Take aspirin only as told by your health care provider. Make sure that you understand how much you should take and what form you should take. The two forms of aspirin are: ? Non-enteric-coated.This type of aspirin does not have a coating and is absorbed quickly. This type of aspirin also comes in a chewable form. ? Enteric-coated. This type of aspirin has a coating that releases the medicine very slowly. Enteric-coated aspirin might cause less stomach upset than non-enteric-coated aspirin. This type of aspirin should not be chewed or crushed.  Limit alcohol intake to no more than 1 drink a day for nonpregnant women and 2 drinks a day for men. Drinking alcohol increases your risk of bleeding. One drink equals 12 oz of beer, 5 oz of wine, or 1 oz of hard liquor. Contact a health care provider if you:  Have unusual bleeding or bruising.  Have stomach pain or nausea.  Have ringing in your ears.  Have an allergic reaction that causes: ? Hives. ? Itchy skin. ? Swelling of the lips, tongue, or face. Get help right away if you:  Notice that your bowel movements are bloody, dark red, or black in color.  Vomit or cough up blood.  Have blood in your urine.  Cough, have noisy breathing (wheeze), or feel short of breath.  Have chest pain, especially if the pain spreads to the arms, back, neck, or jaw.  Have a severe headache, or a headache with confusion, or dizziness. These symptoms may represent a serious problem that is an emergency. Do not wait to see if the symptoms will go away. Get medical help right away. Call your local emergency services (911 in the [place] Do not drive yourself to the hospital. Summary  Aspirin can be used to help reduce the risk of blood clots, heart attacks, and other heart-related problems.  Daily use of  aspirin can increase your risk of side effects. Your health care provider will help you determine whether it is safe and beneficial for you to take aspirin daily.  Take aspirin only as told by your health care provider. Make sure that you understand how much you can take and what form you can take. This information is not intended to replace advice given to you by your health care provider. Make sure you discuss any questions you have with your health care provider. Document Revised: 12/28/2016 Document Reviewed: 12/28/2016 Elsevier Patient Education  2020 [employer].

## 2020-01-30 NOTE — Addendum Note (Signed)
Addended by: Eleonore Chiquito on: 01/30/2020 04:58 PM   Modules accepted: Orders

## 2020-01-30 NOTE — Progress Notes (Signed)
Cardiology Office Note:    Date:  01/30/2020   ID:  Kimberly David, DOB September 03, 1960, MRN [id number]  PCP:  Jovana Sofia, PA-C  Cardiologist:  Garwin Brothers, MD   Referring MD: Ziyanna Sofia, PA-C    ASSESSMENT:    1. Mixed hyperlipidemia   2. Angina pectoris (HCC)   3. Palpitation    PLAN:    In order of problems listed above:  1. Angina pectoris: Patient has symptoms of chest tightness in extension.  Please had concerning.  I gave her several options including stress testing and coronary angiography.  She prefers coronary angiography with FFR CT scanning.  I respect her wishes. 2. Mixed dyslipidemia: Diet was emphasized and she is on statin therapy. 3. In view of her symptoms I told her to take a coated baby aspirin on a daily basis.  Sublingual nitroglycerin prescription was sent, its protocol and 911 protocol explained and the patient vocalized understanding questions were answered to the patient's satisfaction 4. She knows to go to the nearest emergency room for any concerning symptoms. 5. Palpitations: These are mostly resolved. 6. Patient will be seen in follow-up appointment in  months or earlier if the patient has any concerns    Medication Adjustments/Labs and Tests Ordered: Current medicines are reviewed at length with the patient today.  Concerns regarding medicines are outlined above.  No orders of the defined types were placed in this encounter.  No orders of the defined types were placed in this encounter.    No chief complaint on file.    History of Present Illness:    Kimberly David is a 59 y.o. female.  Patient gives history of mixed dyslipidemia and palpitations.  Now she mentions to me that she has chest tightness on exertion especially when she goes up the hill.  No orthopnea or PND.  She is concerned about the symptoms.  She has contacted her activity for the same reason.  At the time of my evaluation, the patient is alert awake oriented  and in no distress.  Past Medical History:  Diagnosis Date  . Abnormal blood chemistry 10/27/2019  . Anxiety   . Cardiac murmur 05/09/2019  . COVID-19   . Depression   . Depression, major, single episode, moderate (HCC) 05/06/2019  . Easy bruising 10/27/2019  . Eustachian tube disorder 12/23/2019  . Ganglion cyst of wrist    Rt  . History of severe acute respiratory syndrome coronavirus 2 (SARS-CoV-2) disease 03/02/2019  . Hyperlipidemia    takes lipitor  . Mixed hyperlipidemia 05/06/2019  . Moderate recurrent major depression (HCC) 06/16/2019  . Other fatigue 07/16/2019  . Otitis media 12/23/2019  . Palpitation 05/06/2019  . Palpitations 05/09/2019    Past Surgical History:  Procedure Laterality Date  . CESAREAN SECTION  02/08/2001  . GANGLION CYST EXCISION  01/11/2012   Procedure: REMOVAL GANGLION OF WRIST;  Surgeon: Tami Ribas, MD;  Location: [facility];  Service: Orthopedics;  Laterality: Right;  right wrist excision mass     Current Medications: Current Meds  Medication Sig  . albuterol (PROAIR HFA) 108 (90 Base) MCG/ACT inhaler ProAir HFA 90 mcg/actuation aerosol inhaler  . ALPRAZolam (XANAX) 0.25 MG tablet TAKE 1 TABLET BY MOUTH AT BEDTIME AS NEEDED.  Marland Kitchen atorvastatin (LIPITOR) 20 MG tablet Take 1 tablet (20 mg total) by mouth daily.  . meclizine (ANTIVERT) 25 MG tablet Take 1 tablet (25 mg total) by mouth every 6 (six) hours as needed for dizziness.  Marland Kitchen  meloxicam (MOBIC) 15 MG tablet Take 1 tablet (15 mg total) by mouth daily.  . metoprolol succinate (TOPROL-XL) 50 MG 24 hr tablet Take 50 mg by mouth at bedtime.   . tolterodine (DETROL LA) 2 MG 24 hr capsule Take 2 mg by mouth daily.  Marland Kitchen venlafaxine XR (EFFEXOR-XR) 75 MG 24 hr capsule TAKE 1 CAPSULE (75 MG TOTAL) BY MOUTH DAILY WITH BREAKFAST.     Allergies:   Codeine   Social History   Socioeconomic History  . Marital status: Legally Separated    Spouse name: Not on file  . Number of children: 3  .  Years of education: Not on file  . Highest education level: Not on file  Occupational History  . Not on file  Tobacco Use  . Smoking status: Never Smoker  . Smokeless tobacco: Never Used  Vaping Use  . Vaping Use: Never used  Substance and Sexual Activity  . Alcohol use: No  . Drug use: No  . Sexual activity: Yes    Birth control/protection: Post-menopausal  Other Topics Concern  . Not on file  Social History Narrative  . Not on file   Social Determinants of Health   Financial Resource Strain:   . Difficulty of Paying Living Expenses: Not on file  Food Insecurity:   . Worried About Programme researcher, broadcasting/film/video in the Last Year: Not on file  . Ran Out of Food in the Last Year: Not on file  Transportation Needs:   . Lack of Transportation (Medical): Not on file  . Lack of Transportation (Non-Medical): Not on file  Physical Activity:   . Days of Exercise per Week: Not on file  . Minutes of Exercise per Session: Not on file  Stress:   . Feeling of Stress : Not on file  Social Connections:   . Frequency of Communication with Friends and Family: Not on file  . Frequency of Social Gatherings with Friends and Family: Not on file  . Attends Religious Services: Not on file  . Active Member of Clubs or Organizations: Not on file  . Attends Banker Meetings: Not on file  . Marital Status: Not on file     Family History: The patient's family history includes Diabetes type I in her daughter; Diabetes type II in her sister; Heart attack in her mother; Hyperlipidemia in her mother; Lung cancer in her father; Migraines in her mother and sister.  ROS:   Please see the history of present illness.    All other systems reviewed and are negative.  EKGs/Labs/Other Studies Reviewed:    The following studies were reviewed today: EKG: Sinus rhythm and nonspecific ST-T changes   Recent Labs: 07/16/2019: TSH 1.080 10/27/2019: ALT 13; BUN 16; Creatinine, Ser 0.72; Hemoglobin 13.4;  Platelets 303; Potassium 4.1; Sodium 141  Recent Lipid Panel    Component Value Date/Time   CHOL 173 05/06/2019 0955   TRIG 123 05/06/2019 0955   HDL 53 05/06/2019 0955   CHOLHDL 3.3 05/06/2019 0955   LDLCALC 98 05/06/2019 0955    Physical Exam:    VS:  BP 132/68   Pulse 96   Ht 5\' 1"  (1.549 m)   Wt 127 lb (57.6 kg)   SpO2 98%   BMI 24.00 kg/m     Wt Readings from Last 3 Encounters:  01/30/20 127 lb (57.6 kg)  12/23/19 116 lb (52.6 kg)  10/27/19 116 lb (52.6 kg)     GEN: Patient is in no acute  distress HEENT: Normal NECK: No JVD; No carotid bruits LYMPHATICS: No lymphadenopathy CARDIAC: Hear sounds regular, 2/6 systolic murmur at the apex. RESPIRATORY:  Clear to auscultation without rales, wheezing or rhonchi  ABDOMEN: Soft, non-tender, non-distended MUSCULOSKELETAL:  No edema; No deformity  SKIN: Warm and dry NEUROLOGIC:  Alert and oriented x 3 PSYCHIATRIC:  Normal affect   Signed, Garwin Brothers, MD  01/30/2020 4:30 PM    [facility]

## 2020-02-21 ENCOUNTER — Other Ambulatory Visit: Payer: Self-pay | Admitting: Physician Assistant

## 2020-02-21 DIAGNOSIS — F419 Anxiety disorder, unspecified: Secondary | ICD-10-CM

## 2020-02-21 DIAGNOSIS — F331 Major depressive disorder, recurrent, moderate: Secondary | ICD-10-CM

## 2020-02-23 ENCOUNTER — Other Ambulatory Visit: Payer: Self-pay

## 2020-02-23 MED ORDER — METOPROLOL SUCCINATE ER 50 MG PO TB24: 50.0000 mg | ORAL_TABLET | Freq: Every day | ORAL | 0 refills | Status: DC

## 2020-02-27 ENCOUNTER — Other Ambulatory Visit: Payer: BC Managed Care – PPO

## 2020-02-27 ENCOUNTER — Other Ambulatory Visit: Payer: Self-pay | Admitting: Physician Assistant

## 2020-02-27 ENCOUNTER — Other Ambulatory Visit: Payer: Self-pay

## 2020-02-27 DIAGNOSIS — I209 Angina pectoris, unspecified: Secondary | ICD-10-CM

## 2020-02-27 LAB — COMPREHENSIVE METABOLIC PANEL
ALT: 13 IU/L (ref 0–32)
AST: 23 IU/L (ref 0–40)
Albumin/Globulin Ratio: 1.7 (ref 1.2–2.2)
Albumin: 4.3 g/dL (ref 3.8–4.9)
Alkaline Phosphatase: 103 IU/L (ref 44–121)
BUN/Creatinine Ratio: 21 (ref 12–28)
BUN: 16 mg/dL (ref 8–27)
Bilirubin Total: 0.3 mg/dL (ref 0.0–1.2)
CO2: 20 mmol/L (ref 20–29)
Calcium: 9.4 mg/dL (ref 8.7–10.3)
Chloride: 103 mmol/L (ref 96–106)
Creatinine, Ser: 0.75 mg/dL (ref 0.57–1.00)
GFR calc Af Amer: 100 mL/min/{1.73_m2} (ref >59)
GFR calc non Af Amer: 87 mL/min/{1.73_m2} (ref >59)
Globulin, Total: 2.5 g/dL (ref 1.5–4.5)
Glucose: 100 mg/dL — ABNORMAL HIGH (ref 65–99)
Potassium: 4 mmol/L (ref 3.5–5.2)
Sodium: 141 mmol/L (ref 134–144)
Total Protein: 6.8 g/dL (ref 6.0–8.5)

## 2020-03-03 ENCOUNTER — Telehealth (HOSPITAL_COMMUNITY): Payer: Self-pay | Admitting: *Deleted

## 2020-03-03 NOTE — Telephone Encounter (Signed)
Attempted to call patient regarding upcoming cardiac CT appointment. Left message on voicemail with name and callback number  Seri Kimmer Tai RN Navigator Cardiac Imaging [facility] [phone] Office [phone] Cell  

## 2020-03-08 ENCOUNTER — Ambulatory Visit (HOSPITAL_COMMUNITY)
Admission: RE | Admit: 2020-03-08 | Discharge: 2020-03-08 | Disposition: A | Discharge status: Home / Self Care | Payer: BC Managed Care – PPO | Source: Ambulatory Visit | Attending: Cardiology | Admitting: Cardiology

## 2020-03-08 ENCOUNTER — Other Ambulatory Visit: Payer: Self-pay

## 2020-03-08 DIAGNOSIS — I208 Other forms of angina pectoris: Secondary | ICD-10-CM | POA: Diagnosis not present

## 2020-03-08 DIAGNOSIS — I209 Angina pectoris, unspecified: Secondary | ICD-10-CM | POA: Insufficient documentation

## 2020-03-08 DIAGNOSIS — Z006 Encounter for examination for normal comparison and control in clinical research program: Secondary | ICD-10-CM

## 2020-03-08 MED ORDER — DILTIAZEM HCL 25 MG/5ML IV SOLN
5.0000 mg | Freq: Once | INTRAVENOUS | Status: AC
  Administered 2020-03-08: 5 mg via INTRAVENOUS
  Filled 2020-03-08: qty 5

## 2020-03-08 MED ORDER — METOPROLOL TARTRATE 5 MG/5ML IV SOLN
5.0000 mg | INTRAVENOUS | Status: DC | PRN
  Administered 2020-03-08 (×2): 5 mg via INTRAVENOUS

## 2020-03-08 MED ORDER — NITROGLYCERIN 0.4 MG SL SUBL
SUBLINGUAL_TABLET | SUBLINGUAL | Status: AC
  Administered 2020-03-08: 0.8 mg via SUBLINGUAL
  Filled 2020-03-08: qty 2

## 2020-03-08 MED ORDER — METOPROLOL TARTRATE 5 MG/5ML IV SOLN
INTRAVENOUS | Status: AC
  Administered 2020-03-08: 5 mg via INTRAVENOUS
  Filled 2020-03-08: qty 15

## 2020-03-08 MED ORDER — DILTIAZEM HCL 25 MG/5ML IV SOLN
INTRAVENOUS | Status: AC
  Filled 2020-03-08: qty 10

## 2020-03-08 MED ORDER — NITROGLYCERIN 0.4 MG SL SUBL: 0.8000 mg | SUBLINGUAL_TABLET | Freq: Once | SUBLINGUAL | Status: AC

## 2020-03-08 MED ORDER — IOHEXOL 350 MG/ML SOLN
80.0000 mL | Freq: Once | INTRAVENOUS | Status: AC | PRN
  Administered 2020-03-08: 80 mL via INTRAVENOUS

## 2020-03-08 NOTE — Research (Signed)
IDENTIFY Informed Consent   Subject Name: Kimberly David  Subject met inclusion and exclusion criteria.  The informed consent form, study requirements and expectations were reviewed with the subject and questions and concerns were addressed prior to the signing of the consent form.  The subject verbalized understanding of the trial requirements.  The subject agreed to participate in the IDENTIFY trial and signed the informed consent at 1507 on 27/DEC/2021.  The informed consent was obtained prior to performance of any protocol-specific procedures for the subject.  A copy of the signed informed consent was given to the subject and a copy was placed in the subject's medical record.   Sherlyn Lees

## 2020-03-09 ENCOUNTER — Telehealth: Payer: Self-pay

## 2020-03-09 NOTE — Telephone Encounter (Signed)
Left message on patients voicemail to please return our call.   

## 2020-03-09 NOTE — Telephone Encounter (Signed)
-----   Message from Rajan R Revankar, MD sent at 03/09/2020  1:44 PM EST ----- The results of the study is unremarkable. Please inform patient. I will discuss in detail at next appointment. Cc  primary care/referring physician Rajan R Revankar, MD 03/09/2020 1:44 PM  

## 2020-03-17 ENCOUNTER — Ambulatory Visit (INDEPENDENT_AMBULATORY_CARE_PROVIDER_SITE_OTHER): Payer: BC Managed Care – PPO | Admitting: Physician Assistant

## 2020-03-17 ENCOUNTER — Encounter: Payer: Self-pay | Admitting: Physician Assistant

## 2020-03-17 ENCOUNTER — Other Ambulatory Visit: Payer: Self-pay

## 2020-03-17 VITALS — BP 122/80 | HR 88 | Temp 97.5°F | Ht 61.0 in | Wt 126.8 lb

## 2020-03-17 DIAGNOSIS — F321 Major depressive disorder, single episode, moderate: Secondary | ICD-10-CM | POA: Diagnosis not present

## 2020-03-17 DIAGNOSIS — E782 Mixed hyperlipidemia: Secondary | ICD-10-CM

## 2020-03-17 DIAGNOSIS — R5383 Other fatigue: Secondary | ICD-10-CM

## 2020-03-17 DIAGNOSIS — Z23 Encounter for immunization: Secondary | ICD-10-CM | POA: Diagnosis not present

## 2020-03-17 HISTORY — DX: Encounter for immunization: Z23

## 2020-03-17 MED ORDER — VENLAFAXINE HCL ER 150 MG PO CP24: 150.0000 mg | ORAL_CAPSULE | Freq: Every day | ORAL | 1 refills | Status: DC

## 2020-03-17 NOTE — Progress Notes (Signed)
Established Patient Office Visit  Subjective:  Patient ID: Kimberly David, female    DOB: 1960-10-12  Age: 60 y.o. MRN: [id number]  CC:  Chief Complaint  Patient presents with  . Hyperlipidemia    HPI Kimberly David presents for fatigue and depression Pt states she has been working a lot, having stress at home, having breakthrough anxiety and depressive symptoms - she has had fleeting thoughts of feeling she would be better off not being here but would not do that because of her daughters Spoke to patient about counseling and she will make an appointment to talk to someone She does not feel like she needs emergent help and wants to get back to work today - she is advised to utilize [facility] [employer] or [facility] if she has these thoughts again She is currently on effexor 75mg  qd and xanax as needed  Pt with history of palpitations - has been following with Dr - she is doing well now and blood pressure has been stable  Mixed hyperlipidemia  Pt presents with hyperlipidemia. P The patient is compliant with medications, maintains a low cholesterol diet , follows up as directed , and maintains an exercise regimen . The patient denies experiencing any hypercholesterolemia related symptoms. Currently on lipitor 20mg  qd  Pt due for tetanus shot -    Past Medical History:  Diagnosis Date  . Abnormal blood chemistry 10/27/2019  . Anxiety   . Cardiac murmur 05/09/2019  . COVID-19   . Depression   . Depression, major, single episode, moderate (HCC) 05/06/2019  . Easy bruising 10/27/2019  . Eustachian tube disorder 12/23/2019  . Ganglion cyst of wrist    Rt  . History of severe acute respiratory syndrome coronavirus 2 (SARS-CoV-2) disease 03/02/2019  . Hyperlipidemia    takes lipitor  . Mixed hyperlipidemia 05/06/2019  . Moderate recurrent major depression (HCC) 06/16/2019  . Other fatigue 07/16/2019  . Otitis media 12/23/2019  . Palpitation 05/06/2019  . Palpitations  05/09/2019    Past Surgical History:  Procedure Laterality Date  . CESAREAN SECTION  02/08/2001  . GANGLION CYST EXCISION  01/11/2012   Procedure: REMOVAL GANGLION OF WRIST;  Surgeon: 02/10/2001, MD;  Location:  [facility];  Service: Orthopedics;  Laterality: Right;  right wrist excision mass     Family History  Problem Relation Age of Onset  . Hyperlipidemia Mother   . Heart attack Mother   . Migraines Mother   . Lung cancer Father   . Diabetes type II Sister   . Migraines Sister   . Diabetes type I Daughter     Social History   Socioeconomic History  . Marital status: Legally Separated    Spouse name: Not on file  . Number of children: 3  . Years of education: Not on file  . Highest education level: Not on file  Occupational History  . Not on file  Tobacco Use  . Smoking status: Never Smoker  . Smokeless tobacco: Never Used  Vaping Use  . Vaping Use: Never used  Substance and Sexual Activity  . Alcohol use: No  . Drug use: No  . Sexual activity: Yes    Birth control/protection: Post-menopausal  Other Topics Concern  . Not on file  Social History Narrative  . Not on file   Social Determinants of Health   Financial Resource Strain: Not on file  Food Insecurity: Not on file  Transportation Needs: Not on file  Physical Activity:  Not on file  Stress: Not on file  Social Connections: Not on file  Intimate Partner Violence: Not on file     Current Outpatient Medications:  .  albuterol (VENTOLIN HFA) 108 (90 Base) MCG/ACT inhaler, ProAir HFA 90 mcg/actuation aerosol inhaler, Disp: , Rfl:  .  ALPRAZolam (XANAX) 0.25 MG tablet, TAKE 1 TABLET BY MOUTH EVERY DAY AT BEDTIME AS NEEDED, Disp: 30 tablet, Rfl: 0 .  aspirin EC 81 MG tablet, Take 1 tablet (81 mg total) by mouth daily. Swallow whole., Disp: 90 tablet, Rfl: 3 .  atorvastatin (LIPITOR) 20 MG tablet, TAKE 1 TABLET BY MOUTH EVERY DAY, Disp: 90 tablet, Rfl: 0 .  FYAVOLV 1-5 MG-MCG TABS tablet,  , Disp: , Rfl:  .  meclizine (ANTIVERT) 25 MG tablet, Take 1 tablet (25 mg total) by mouth every 6 (six) hours as needed for dizziness., Disp: 30 tablet, Rfl: 0 .  meloxicam (MOBIC) 15 MG tablet, TAKE 1 TABLET BY MOUTH EVERY DAY, Disp: 30 tablet, Rfl: 0 .  metoprolol succinate (TOPROL-XL) 50 MG 24 hr tablet, Take 1 tablet (50 mg total) by mouth at bedtime., Disp: 30 tablet, Rfl: 0 .  nitroGLYCERIN (NITROSTAT) 0.4 MG SL tablet, Place 1 tablet (0.4 mg total) under the tongue every 5 (five) minutes as needed., Disp: 25 tablet, Rfl: 6 .  tolterodine (DETROL LA) 2 MG 24 hr capsule, Take 2 mg by mouth daily., Disp: , Rfl:  .  venlafaxine XR (EFFEXOR XR) 150 MG 24 hr capsule, Take 1 capsule (150 mg total) by mouth daily with breakfast., Disp: 30 capsule, Rfl: 1   Allergies  Allergen Reactions  . Codeine Nausea And Vomiting    ROS CONSTITUTIONAL:see HPI  CARDIOVASCULAR: see HPI RESPIRATORY: Negative for recent cough and dyspnea.  GASTROINTESTINAL: Negative for abdominal pain, acid reflux symptoms, constipation, diarrhea, nausea and vomiting.  Skin - negative NEUROLOGICAL: Negative for dizziness and headaches.  PSYCHIATRIC: see HPI       Objective:    PHYSICAL EXAM:   VS: BP 122/80 (BP Location: Left Arm, Patient Position: Sitting, Cuff Size: Normal)   Pulse 88   Temp (!) 97.5 F (36.4 C) (Temporal)   Ht 5\' 1"  (1.549 m)   Wt 126 lb 12.8 oz (57.5 kg)   SpO2 95%   BMI 23.96 kg/m   GEN: Well nourished, well developed, in no acute distress   Cardiac: RRR; no murmurs, rubs, or gallops,no edema - no significant varicosities Respiratory:  normal respiratory rate and pattern with no distress - normal breath sounds with no rales, rhonchi, wheezes or rubs Skin - few bruises noted on upper arms Neuro:  Alert and Oriented x 3,  - CN II-Xii grossly intact Psych: tearful  BP 122/80 (BP Location: Left Arm, Patient Position: Sitting, Cuff Size: Normal)   Pulse 88   Temp (!) 97.5 F (36.4 C)  (Temporal)   Ht 5\' 1"  (1.549 m)   Wt 126 lb 12.8 oz (57.5 kg)   SpO2 95%   BMI 23.96 kg/m  Wt Readings from Last 3 Encounters:  03/17/20 126 lb 12.8 oz (57.5 kg)  01/30/20 127 lb (57.6 kg)  12/23/19 116 lb (52.6 kg)     Health Maintenance Due  Topic Date Due  . Hepatitis C Screening  Never done  . HIV Screening  Never done  . COLONOSCOPY (Pts 45-75yrs Insurance coverage will need to be confirmed)  Never done  . PAP SMEAR-Modifier  03/10/2019    There are no preventive care reminders  to display for this patient.  Lab Results  Component Value Date   TSH 1.080 07/16/2019   Lab Results  Component Value Date   WBC 7.8 10/27/2019   HGB 13.4 10/27/2019   HCT 38.4 10/27/2019   MCV 93 10/27/2019   PLT 303 10/27/2019   Lab Results  Component Value Date   NA 141 02/27/2020   K 4.0 02/27/2020   CO2 20 02/27/2020   GLUCOSE 100 (H) 02/27/2020   BUN 16 02/27/2020   CREATININE 0.75 02/27/2020   BILITOT 0.3 02/27/2020   ALKPHOS 103 02/27/2020   AST 23 02/27/2020   ALT 13 02/27/2020   PROT 6.8 02/27/2020   ALBUMIN 4.3 02/27/2020   CALCIUM 9.4 02/27/2020   Lab Results  Component Value Date   CHOL 173 05/06/2019   Lab Results  Component Value Date   HDL 53 05/06/2019   Lab Results  Component Value Date   LDLCALC 98 05/06/2019   Lab Results  Component Value Date   TRIG 123 05/06/2019   Lab Results  Component Value Date   CHOLHDL 3.3 05/06/2019   No results found for: HGBA1C    Assessment & Plan:   Problem List Items Addressed This Visit      Other   Mixed hyperlipidemia   Relevant Orders   Lipid panel   Depression, major, single episode, moderate (HCC)   Relevant Medications   venlafaxine XR (EFFEXOR XR) 150 MG 24 hr capsule   Other fatigue - Primary   Relevant Orders   CBC with Differential/Platelet   Comprehensive metabolic panel   TSH   Need for tetanus, diphtheria, and acellular pertussis (Tdap) vaccine   Relevant Orders   Tdap vaccine  greater than or equal to 7yo IM (Completed)      Meds ordered this encounter  Medications  . venlafaxine XR (EFFEXOR XR) 150 MG 24 hr capsule    Sig: Take 1 capsule (150 mg total) by mouth daily with breakfast.    Dispense:  30 capsule    Refill:  1    Order Specific Question:   Supervising Provider    AnswerCorey Harold    Follow-up: Return in about 6 weeks (around 04/28/2020) for follow up.    SARA R Ieesha Abbasi, PA-C

## 2020-03-18 LAB — COMPREHENSIVE METABOLIC PANEL
ALT: 10 IU/L (ref 0–32)
AST: 19 IU/L (ref 0–40)
Albumin/Globulin Ratio: 1.8 (ref 1.2–2.2)
Albumin: 4.2 g/dL (ref 3.8–4.9)
Alkaline Phosphatase: 111 IU/L (ref 44–121)
BUN/Creatinine Ratio: 26 (ref 12–28)
BUN: 19 mg/dL (ref 8–27)
Bilirubin Total: 0.4 mg/dL (ref 0.0–1.2)
CO2: 20 mmol/L (ref 20–29)
Calcium: 9.1 mg/dL (ref 8.7–10.3)
Chloride: 102 mmol/L (ref 96–106)
Creatinine, Ser: 0.74 mg/dL (ref 0.57–1.00)
GFR calc Af Amer: 102 mL/min/{1.73_m2} (ref >59)
GFR calc non Af Amer: 88 mL/min/{1.73_m2} (ref >59)
Globulin, Total: 2.4 g/dL (ref 1.5–4.5)
Glucose: 94 mg/dL (ref 65–99)
Potassium: 4 mmol/L (ref 3.5–5.2)
Sodium: 138 mmol/L (ref 134–144)
Total Protein: 6.6 g/dL (ref 6.0–8.5)

## 2020-03-18 LAB — CBC WITH DIFFERENTIAL/PLATELET
Basophils Absolute: 0.1 10*3/uL (ref 0.0–0.2)
Basos: 2 %
EOS (ABSOLUTE): 0.4 10*3/uL (ref 0.0–0.4)
Eos: 5 %
Hematocrit: 38.4 % (ref 34.0–46.6)
Hemoglobin: 13.2 g/dL (ref 11.1–15.9)
Immature Grans (Abs): 0 10*3/uL (ref 0.0–0.1)
Immature Granulocytes: 0 %
Lymphocytes Absolute: 1.9 10*3/uL (ref 0.7–3.1)
Lymphs: 23 %
MCH: 31.9 pg (ref 26.6–33.0)
MCHC: 34.4 g/dL (ref 31.5–35.7)
MCV: 93 fL (ref 79–97)
Monocytes Absolute: 0.6 10*3/uL (ref 0.1–0.9)
Monocytes: 7 %
Neutrophils Absolute: 5.3 10*3/uL (ref 1.4–7.0)
Neutrophils: 63 %
Platelets: 296 10*3/uL (ref 150–450)
RBC: 4.14 x10E6/uL (ref 3.77–5.28)
RDW: 12.8 % (ref 11.7–15.4)
WBC: 8.3 10*3/uL (ref 3.4–10.8)

## 2020-03-18 LAB — CARDIOVASCULAR RISK ASSESSMENT

## 2020-03-18 LAB — LIPID PANEL
Chol/HDL Ratio: 3.4 ratio (ref 0.0–4.4)
Cholesterol, Total: 169 mg/dL (ref 100–199)
HDL: 49 mg/dL (ref >39)
LDL Chol Calc (NIH): 97 mg/dL (ref 0–99)
Triglycerides: 131 mg/dL (ref 0–149)
VLDL Cholesterol Cal: 23 mg/dL (ref 5–40)

## 2020-03-18 LAB — TSH: TSH: 2.13 u[IU]/mL (ref 0.450–4.500)

## 2020-03-20 ENCOUNTER — Other Ambulatory Visit: Payer: Self-pay | Admitting: Physician Assistant

## 2020-03-24 ENCOUNTER — Other Ambulatory Visit: Payer: Self-pay | Admitting: Family Medicine

## 2020-03-24 ENCOUNTER — Other Ambulatory Visit: Payer: Self-pay | Admitting: Physician Assistant

## 2020-03-24 DIAGNOSIS — F331 Major depressive disorder, recurrent, moderate: Secondary | ICD-10-CM

## 2020-03-31 ENCOUNTER — Ambulatory Visit: Payer: BC Managed Care – PPO | Admitting: Cardiology

## 2020-04-08 ENCOUNTER — Other Ambulatory Visit: Payer: Self-pay | Admitting: Physician Assistant

## 2020-04-08 DIAGNOSIS — F321 Major depressive disorder, single episode, moderate: Secondary | ICD-10-CM

## 2020-04-19 ENCOUNTER — Other Ambulatory Visit: Payer: Self-pay | Admitting: Physician Assistant

## 2020-04-22 ENCOUNTER — Ambulatory Visit: Payer: BC Managed Care – PPO | Admitting: Cardiology

## 2020-04-26 ENCOUNTER — Encounter: Payer: Self-pay | Admitting: Cardiology

## 2020-04-26 ENCOUNTER — Ambulatory Visit (INDEPENDENT_AMBULATORY_CARE_PROVIDER_SITE_OTHER): Payer: BC Managed Care – PPO | Admitting: Cardiology

## 2020-04-26 ENCOUNTER — Other Ambulatory Visit: Payer: Self-pay

## 2020-04-26 VITALS — BP 134/78 | HR 78 | Ht 61.0 in | Wt 125.4 lb

## 2020-04-26 DIAGNOSIS — E782 Mixed hyperlipidemia: Secondary | ICD-10-CM | POA: Diagnosis not present

## 2020-04-26 DIAGNOSIS — R002 Palpitations: Secondary | ICD-10-CM | POA: Diagnosis not present

## 2020-04-26 NOTE — Patient Instructions (Signed)

## 2020-04-26 NOTE — Progress Notes (Signed)
Cardiology Office Note:    Date:  04/26/2020   ID:  Kimberly David, DOB March 29, 1960, MRN [id number]  PCP:  Ashyla Sofia, PA-C  Cardiologist:  Garwin Brothers, MD   Referring MD: Sivan Sofia, PA-C    ASSESSMENT:    No diagnosis found. PLAN:    In order of problems listed above:  1. Primary prevention stressed with the patient.  Importance of compliance with diet medication stressed and she vocalized understanding.  Coronary angiography report was discussed with her at extensive length and she was happy about it. 2. Essential hypertension: Blood pressure stable and diet was emphasized.  She has an excellent effort tolerance and a good exercise protocol and I encouraged her to continue that. 3. Mixed dyslipidemia: Diet was emphasized.  Patient is on statin therapy and lipids were reviewed at this time she plans to continue it and wants to keep her lipids low because of family history of coronary artery disease.  I respect her wishes. 4. Patient will be seen in follow-up appointment in 6 months or earlier if the patient has any concerns    Medication Adjustments/Labs and Tests Ordered: Current medicines are reviewed at length with the patient today.  Concerns regarding medicines are outlined above.  No orders of the defined types were placed in this encounter.  No orders of the defined types were placed in this encounter.    No chief complaint on file.    History of Present Illness:    Kimberly David is a 60 y.o. female.  Patient was evaluated for essential hypertension and dyslipidemia and chest pain.  Fortunately calcium scoring was 0 on CT scan and CT angiography also was unremarkable.  She is very relieved to know the results of this test.  She exercises on a regular basis.  No chest pain orthopnea or PND.  She is getting better from Covid infection.  At the time of my evaluation, the patient is alert awake oriented and in no distress.  Past Medical History:   Diagnosis Date  . Abnormal blood chemistry 10/27/2019  . Angina pectoris (HCC) 01/30/2020  . Anxiety   . Cardiac murmur 05/09/2019  . COVID-19   . Depression   . Depression, major, single episode, moderate (HCC) 05/06/2019  . Easy bruising 10/27/2019  . Eustachian tube disorder 12/23/2019  . Ganglion cyst of wrist    Rt  . History of severe acute respiratory syndrome coronavirus 2 (SARS-CoV-2) disease 03/02/2019  . Hyperlipidemia    takes lipitor  . Mixed hyperlipidemia 05/06/2019  . Moderate recurrent major depression (HCC) 06/16/2019  . Need for tetanus, diphtheria, and acellular pertussis (Tdap) vaccine 03/17/2020  . Other fatigue 07/16/2019  . Otitis media 12/23/2019  . Palpitation 05/06/2019  . Palpitations 05/09/2019    Past Surgical History:  Procedure Laterality Date  . CESAREAN SECTION  02/08/2001  . GANGLION CYST EXCISION  01/11/2012   Procedure: REMOVAL GANGLION OF WRIST;  Surgeon: Tami Ribas, MD;  Location: [facility];  Service: Orthopedics;  Laterality: Right;  right wrist excision mass     Current Medications: Current Meds  Medication Sig  . albuterol (VENTOLIN HFA) 108 (90 Base) MCG/ACT inhaler Inhale 1-2 puffs into the lungs every 4 (four) hours as needed.  . ALPRAZolam (XANAX) 0.25 MG tablet Take 0.25 mg by mouth at bedtime as needed for anxiety.  Marland Kitchen aspirin EC 81 MG tablet Take 1 tablet (81 mg total) by mouth daily. Swallow whole.  Marland Kitchen atorvastatin (LIPITOR) 20  MG tablet TAKE 1 TABLET BY MOUTH EVERY DAY  . FYAVOLV 1-5 MG-MCG TABS tablet Take 1 tablet by mouth daily.  . meclizine (ANTIVERT) 25 MG tablet TAKE 1 TABLET EVERY 6 HOURS AS NEEDED FOR DIZZINESS  . meloxicam (MOBIC) 15 MG tablet Take 15 mg by mouth as needed for pain.  . metoprolol succinate (TOPROL-XL) 50 MG 24 hr tablet TAKE 1 TABLET BY MOUTH EVERYDAY AT BEDTIME  . nitroGLYCERIN (NITROSTAT) 0.4 MG SL tablet Place 0.4 mg under the tongue every 5 (five) minutes as needed for chest pain.  Marland Kitchen  tolterodine (DETROL LA) 2 MG 24 hr capsule Take 2 mg by mouth daily.  Marland Kitchen venlafaxine XR (EFFEXOR-XR) 150 MG 24 hr capsule TAKE 1 CAPSULE BY MOUTH DAILY WITH BREAKFAST.     Allergies:   Codeine   Social History   Socioeconomic History  . Marital status: Legally Separated    Spouse name: Not on file  . Number of children: 3  . Years of education: Not on file  . Highest education level: Not on file  Occupational History  . Not on file  Tobacco Use  . Smoking status: Never Smoker  . Smokeless tobacco: Never Used  Vaping Use  . Vaping Use: Never used  Substance and Sexual Activity  . Alcohol use: No  . Drug use: No  . Sexual activity: Yes    Birth control/protection: Post-menopausal  Other Topics Concern  . Not on file  Social History Narrative  . Not on file   Social Determinants of Health   Financial Resource Strain: Not on file  Food Insecurity: Not on file  Transportation Needs: Not on file  Physical Activity: Not on file  Stress: Not on file  Social Connections: Not on file     Family History: The patient's family history includes Diabetes type I in her daughter; Diabetes type II in her sister; Heart attack in her mother; Hyperlipidemia in her mother; Lung cancer in her father; Migraines in her mother and sister.  ROS:   Please see the history of present illness.    All other systems reviewed and are negative.  EKGs/Labs/Other Studies Reviewed:    The following studies were reviewed today: IMPRESSION: 1. No evidence of CAD, CADRADS = 0.  2. Coronary calcium score of 0. This was 0 percentile for age and sex matched control.  3. Normal coronary origin with right dominance.  4.  Small PFO with left to right flow.   Electronically Signed   By: Jodelle Red M.D.   On: 03/08/2020 18:01   Recent Labs: 03/17/2020: ALT 10; BUN 19; Creatinine, Ser 0.74; Hemoglobin 13.2; Platelets 296; Potassium 4.0; Sodium 138; TSH 2.130  Recent Lipid Panel     Component Value Date/Time   CHOL 169 03/17/2020 0957   TRIG 131 03/17/2020 0957   HDL 49 03/17/2020 0957   CHOLHDL 3.4 03/17/2020 0957   LDLCALC 97 03/17/2020 0957    Physical Exam:    VS:  BP 134/78   Pulse 78   Ht 5\' 1"  (1.549 m)   Wt 125 lb 6.4 oz (56.9 kg)   SpO2 98%   BMI 23.69 kg/m     Wt Readings from Last 3 Encounters:  04/26/20 125 lb 6.4 oz (56.9 kg)  03/17/20 126 lb 12.8 oz (57.5 kg)  01/30/20 127 lb (57.6 kg)     GEN: Patient is in no acute distress HEENT: Normal NECK: No JVD; No carotid bruits LYMPHATICS: No lymphadenopathy CARDIAC: Hear sounds  regular, 2/6 systolic murmur at the apex. RESPIRATORY:  Clear to auscultation without rales, wheezing or rhonchi  ABDOMEN: Soft, non-tender, non-distended MUSCULOSKELETAL:  No edema; No deformity  SKIN: Warm and dry NEUROLOGIC:  Alert and oriented x 3 PSYCHIATRIC:  Normal affect   Signed, Garwin Brothers, MD  04/26/2020 2:18 PM    [facility]

## 2020-04-29 ENCOUNTER — Other Ambulatory Visit: Payer: Self-pay | Admitting: Physician Assistant

## 2020-04-29 DIAGNOSIS — F419 Anxiety disorder, unspecified: Secondary | ICD-10-CM

## 2020-05-05 ENCOUNTER — Ambulatory Visit: Payer: BC Managed Care – PPO | Admitting: Physician Assistant

## 2020-05-05 ENCOUNTER — Other Ambulatory Visit: Payer: Self-pay | Admitting: Physician Assistant

## 2020-05-25 ENCOUNTER — Other Ambulatory Visit: Payer: Self-pay | Admitting: Family Medicine

## 2020-05-26 ENCOUNTER — Emergency Department (HOSPITAL_COMMUNITY): Payer: BC Managed Care – PPO

## 2020-05-26 ENCOUNTER — Other Ambulatory Visit: Payer: Self-pay

## 2020-05-26 ENCOUNTER — Encounter (HOSPITAL_COMMUNITY): Payer: Self-pay | Admitting: Emergency Medicine

## 2020-05-26 ENCOUNTER — Inpatient Hospital Stay (HOSPITAL_COMMUNITY)
Admission: EM | Admit: 2020-05-26 | Discharge: 2020-05-28 | DRG: 872 | Disposition: A | Discharge status: Home / Self Care | Payer: BC Managed Care – PPO | Attending: Internal Medicine | Admitting: Internal Medicine

## 2020-05-26 DIAGNOSIS — Z79899 Other long term (current) drug therapy: Secondary | ICD-10-CM | POA: Diagnosis not present

## 2020-05-26 DIAGNOSIS — R6883 Chills (without fever): Secondary | ICD-10-CM | POA: Diagnosis not present

## 2020-05-26 DIAGNOSIS — R112 Nausea with vomiting, unspecified: Secondary | ICD-10-CM | POA: Diagnosis present

## 2020-05-26 DIAGNOSIS — A4151 Sepsis due to Escherichia coli [E. coli]: Secondary | ICD-10-CM | POA: Diagnosis present

## 2020-05-26 DIAGNOSIS — R079 Chest pain, unspecified: Secondary | ICD-10-CM | POA: Diagnosis not present

## 2020-05-26 DIAGNOSIS — R002 Palpitations: Secondary | ICD-10-CM | POA: Diagnosis present

## 2020-05-26 DIAGNOSIS — Z20822 Contact with and (suspected) exposure to covid-19: Secondary | ICD-10-CM | POA: Diagnosis present

## 2020-05-26 DIAGNOSIS — Z8249 Family history of ischemic heart disease and other diseases of the circulatory system: Secondary | ICD-10-CM

## 2020-05-26 DIAGNOSIS — Z8616 Personal history of COVID-19: Secondary | ICD-10-CM

## 2020-05-26 DIAGNOSIS — E782 Mixed hyperlipidemia: Secondary | ICD-10-CM | POA: Diagnosis present

## 2020-05-26 DIAGNOSIS — N10 Acute tubulo-interstitial nephritis: Secondary | ICD-10-CM | POA: Diagnosis not present

## 2020-05-26 DIAGNOSIS — Z833 Family history of diabetes mellitus: Secondary | ICD-10-CM | POA: Diagnosis not present

## 2020-05-26 DIAGNOSIS — Z801 Family history of malignant neoplasm of trachea, bronchus and lung: Secondary | ICD-10-CM

## 2020-05-26 DIAGNOSIS — F419 Anxiety disorder, unspecified: Secondary | ICD-10-CM | POA: Diagnosis not present

## 2020-05-26 DIAGNOSIS — Z885 Allergy status to narcotic agent status: Secondary | ICD-10-CM | POA: Diagnosis not present

## 2020-05-26 DIAGNOSIS — R109 Unspecified abdominal pain: Secondary | ICD-10-CM | POA: Diagnosis not present

## 2020-05-26 DIAGNOSIS — Q211 Atrial septal defect: Secondary | ICD-10-CM | POA: Insufficient documentation

## 2020-05-26 DIAGNOSIS — F331 Major depressive disorder, recurrent, moderate: Secondary | ICD-10-CM | POA: Diagnosis not present

## 2020-05-26 DIAGNOSIS — Z83438 Family history of other disorder of lipoprotein metabolism and other lipidemia: Secondary | ICD-10-CM

## 2020-05-26 DIAGNOSIS — A419 Sepsis, unspecified organism: Secondary | ICD-10-CM | POA: Diagnosis present

## 2020-05-26 DIAGNOSIS — N12 Tubulo-interstitial nephritis, not specified as acute or chronic: Secondary | ICD-10-CM | POA: Diagnosis not present

## 2020-05-26 DIAGNOSIS — Q2112 Patent foramen ovale: Secondary | ICD-10-CM | POA: Insufficient documentation

## 2020-05-26 DIAGNOSIS — R000 Tachycardia, unspecified: Secondary | ICD-10-CM | POA: Diagnosis not present

## 2020-05-26 DIAGNOSIS — Z7982 Long term (current) use of aspirin: Secondary | ICD-10-CM

## 2020-05-26 LAB — COMPREHENSIVE METABOLIC PANEL
ALT: 18 U/L (ref 0–44)
AST: 25 U/L (ref 15–41)
Albumin: 3.6 g/dL (ref 3.5–5.0)
Alkaline Phosphatase: 82 U/L (ref 38–126)
Anion gap: 11 (ref 5–15)
BUN: 20 mg/dL (ref 6–20)
CO2: 21 mmol/L — ABNORMAL LOW (ref 22–32)
Calcium: 9.1 mg/dL (ref 8.9–10.3)
Chloride: 102 mmol/L (ref 98–111)
Creatinine, Ser: 0.93 mg/dL (ref 0.44–1.00)
GFR, Estimated: >60 mL/min (ref >60)
Glucose, Bld: 182 mg/dL — ABNORMAL HIGH (ref 70–99)
Potassium: 3.3 mmol/L — ABNORMAL LOW (ref 3.5–5.1)
Sodium: 134 mmol/L — ABNORMAL LOW (ref 135–145)
Total Bilirubin: 0.8 mg/dL (ref 0.3–1.2)
Total Protein: 6.7 g/dL (ref 6.5–8.1)

## 2020-05-26 LAB — CBC
HCT: 35.8 % — ABNORMAL LOW (ref 36.0–46.0)
Hemoglobin: 12.4 g/dL (ref 12.0–15.0)
MCH: 32.1 pg (ref 26.0–34.0)
MCHC: 34.6 g/dL (ref 30.0–36.0)
MCV: 92.7 fL (ref 80.0–100.0)
Platelets: 268 10*3/uL (ref 150–400)
RBC: 3.86 MIL/uL — ABNORMAL LOW (ref 3.87–5.11)
RDW: 13.1 % (ref 11.5–15.5)
WBC: 12.4 10*3/uL — ABNORMAL HIGH (ref 4.0–10.5)
nRBC: 0 % (ref 0.0–0.2)

## 2020-05-26 LAB — RESP PANEL BY RT-PCR (FLU A&B, COVID) ARPGX2
Influenza A by PCR: NEGATIVE
Influenza B by PCR: NEGATIVE
SARS Coronavirus 2 by RT PCR: NEGATIVE

## 2020-05-26 LAB — APTT: aPTT: 33 seconds (ref 24–36)

## 2020-05-26 LAB — TROPONIN I (HIGH SENSITIVITY)
Troponin I (High Sensitivity): 4 ng/L (ref <18)
Troponin I (High Sensitivity): 6 ng/L (ref <18)

## 2020-05-26 LAB — URINALYSIS, ROUTINE W REFLEX MICROSCOPIC
Bilirubin Urine: NEGATIVE
Glucose, UA: NEGATIVE mg/dL
Ketones, ur: 5 mg/dL — AB
Nitrite: POSITIVE — AB
Protein, ur: NEGATIVE mg/dL
Specific Gravity, Urine: 1.008 (ref 1.005–1.030)
WBC, UA: >50 WBC/hpf — ABNORMAL HIGH (ref 0–5)
pH: 6 (ref 5.0–8.0)

## 2020-05-26 LAB — LIPASE, BLOOD: Lipase: 36 U/L (ref 11–51)

## 2020-05-26 LAB — LACTIC ACID, PLASMA: Lactic Acid, Venous: 1.5 mmol/L (ref 0.5–1.9)

## 2020-05-26 LAB — PROTIME-INR
INR: 1.1 (ref 0.8–1.2)
Prothrombin Time: 13.5 seconds (ref 11.4–15.2)

## 2020-05-26 MED ORDER — FESOTERODINE FUMARATE ER 4 MG PO TB24
4.0000 mg | ORAL_TABLET | Freq: Every day | ORAL | Status: DC
Start: 2020-05-27 — End: 2020-05-28
  Administered 2020-05-27 – 2020-05-28 (×2): 4 mg via ORAL
  Filled 2020-05-26 (×2): qty 1

## 2020-05-26 MED ORDER — HYDROCODONE-ACETAMINOPHEN 5-325 MG PO TABS
1.0000 | ORAL_TABLET | ORAL | Status: DC | PRN
  Administered 2020-05-26 – 2020-05-27 (×3): 1 via ORAL
  Filled 2020-05-26 (×4): qty 1

## 2020-05-26 MED ORDER — ONDANSETRON HCL 4 MG/2ML IJ SOLN
4.0000 mg | Freq: Once | INTRAMUSCULAR | Status: AC
  Administered 2020-05-26: 4 mg via INTRAVENOUS
  Filled 2020-05-26: qty 2

## 2020-05-26 MED ORDER — VENLAFAXINE HCL ER 75 MG PO CP24
150.0000 mg | ORAL_CAPSULE | Freq: Every day | ORAL | Status: DC
  Administered 2020-05-27 – 2020-05-28 (×2): 150 mg via ORAL
  Filled 2020-05-26 (×3): qty 1
  Filled 2020-05-26: qty 2

## 2020-05-26 MED ORDER — ACETAMINOPHEN 325 MG PO TABS
650.0000 mg | ORAL_TABLET | Freq: Four times a day (QID) | ORAL | Status: DC | PRN
  Administered 2020-05-27 – 2020-05-28 (×3): 650 mg via ORAL
  Filled 2020-05-26 (×3): qty 2

## 2020-05-26 MED ORDER — KETOROLAC TROMETHAMINE 15 MG/ML IJ SOLN
15.0000 mg | Freq: Once | INTRAMUSCULAR | Status: AC
  Administered 2020-05-26: 15 mg via INTRAVENOUS
  Filled 2020-05-26: qty 1

## 2020-05-26 MED ORDER — ACETAMINOPHEN 650 MG RE SUPP: 650.0000 mg | Freq: Four times a day (QID) | RECTAL | Status: DC | PRN

## 2020-05-26 MED ORDER — ENOXAPARIN SODIUM 40 MG/0.4ML ~~LOC~~ SOLN
40.0000 mg | SUBCUTANEOUS | Status: DC
  Administered 2020-05-26 – 2020-05-27 (×2): 40 mg via SUBCUTANEOUS
  Filled 2020-05-26 (×2): qty 0.4

## 2020-05-26 MED ORDER — ALBUTEROL SULFATE HFA 108 (90 BASE) MCG/ACT IN AERS: 1.0000 | INHALATION_SPRAY | RESPIRATORY_TRACT | Status: DC | PRN

## 2020-05-26 MED ORDER — LACTATED RINGERS IV SOLN: INTRAVENOUS | Status: AC

## 2020-05-26 MED ORDER — SODIUM CHLORIDE 0.9 % IV SOLN
1.0000 g | INTRAVENOUS | Status: DC
  Administered 2020-05-28: 1 g via INTRAVENOUS
  Filled 2020-05-26 (×4): qty 10

## 2020-05-26 MED ORDER — ONDANSETRON HCL 4 MG/2ML IJ SOLN
4.0000 mg | Freq: Four times a day (QID) | INTRAMUSCULAR | Status: DC | PRN
  Administered 2020-05-27: 4 mg via INTRAVENOUS
  Filled 2020-05-26: qty 2

## 2020-05-26 MED ORDER — ACETAMINOPHEN 500 MG PO TABS
1000.0000 mg | ORAL_TABLET | Freq: Once | ORAL | Status: AC
  Administered 2020-05-26: 1000 mg via ORAL
  Filled 2020-05-26: qty 2

## 2020-05-26 MED ORDER — HYDROMORPHONE HCL 1 MG/ML IJ SOLN
0.5000 mg | Freq: Once | INTRAMUSCULAR | Status: AC
Start: 2020-05-26 — End: 2020-05-26
  Administered 2020-05-26: 0.5 mg via INTRAVENOUS
  Filled 2020-05-26: qty 1

## 2020-05-26 MED ORDER — ACETAMINOPHEN 325 MG PO TABS
650.0000 mg | ORAL_TABLET | Freq: Once | ORAL | Status: AC | PRN
  Administered 2020-05-26: 650 mg via ORAL
  Filled 2020-05-26: qty 2

## 2020-05-26 MED ORDER — SODIUM CHLORIDE 0.9 % IV SOLN
2.0000 g | Freq: Once | INTRAVENOUS | Status: AC
  Administered 2020-05-26: 2 g via INTRAVENOUS
  Filled 2020-05-26: qty 20

## 2020-05-26 MED ORDER — NORETHINDRONE-ETH ESTRADIOL 1-5 MG-MCG PO TABS: 1.0000 | ORAL_TABLET | Freq: Every day | ORAL | Status: DC

## 2020-05-26 MED ORDER — ONDANSETRON HCL 4 MG PO TABS: 4.0000 mg | ORAL_TABLET | Freq: Four times a day (QID) | ORAL | Status: DC | PRN

## 2020-05-26 MED ORDER — LACTATED RINGERS IV BOLUS (SEPSIS)
250.0000 mL | Freq: Once | INTRAVENOUS | Status: AC
  Administered 2020-05-26: 250 mL via INTRAVENOUS

## 2020-05-26 MED ORDER — MECLIZINE HCL 25 MG PO TABS
25.0000 mg | ORAL_TABLET | Freq: Every day | ORAL | Status: DC
  Administered 2020-05-27 – 2020-05-28 (×2): 25 mg via ORAL
  Filled 2020-05-26 (×2): qty 1

## 2020-05-26 MED ORDER — ALPRAZOLAM 0.25 MG PO TABS: 0.2500 mg | ORAL_TABLET | Freq: Every evening | ORAL | Status: DC | PRN

## 2020-05-26 MED ORDER — LACTATED RINGERS IV BOLUS (SEPSIS)
1000.0000 mL | Freq: Once | INTRAVENOUS | Status: AC
  Administered 2020-05-26: 1000 mL via INTRAVENOUS

## 2020-05-26 MED ORDER — LACTATED RINGERS IV BOLUS (SEPSIS)
500.0000 mL | Freq: Once | INTRAVENOUS | Status: AC
  Administered 2020-05-26: 500 mL via INTRAVENOUS

## 2020-05-26 MED ORDER — ASPIRIN EC 81 MG PO TBEC
81.0000 mg | DELAYED_RELEASE_TABLET | Freq: Every day | ORAL | Status: DC
Start: 2020-05-27 — End: 2020-05-28
  Administered 2020-05-27 – 2020-05-28 (×2): 81 mg via ORAL
  Filled 2020-05-26 (×2): qty 1

## 2020-05-26 MED ORDER — ATORVASTATIN CALCIUM 10 MG PO TABS
20.0000 mg | ORAL_TABLET | Freq: Every day | ORAL | Status: DC
  Administered 2020-05-27 – 2020-05-28 (×2): 20 mg via ORAL
  Filled 2020-05-26 (×2): qty 2

## 2020-05-26 MED ORDER — POTASSIUM CHLORIDE CRYS ER 20 MEQ PO TBCR
20.0000 meq | EXTENDED_RELEASE_TABLET | Freq: Once | ORAL | Status: AC
Start: 2020-05-26 — End: 2020-05-26
  Administered 2020-05-26: 20 meq via ORAL
  Filled 2020-05-26: qty 1

## 2020-05-26 NOTE — ED Notes (Signed)
Patient transported to CT 

## 2020-05-26 NOTE — H&P (Signed)
History and Physical    Kimberly David MAU:[id number] DOB: [birth year] DOA: 05/26/2020  PCP: Malin Sofia, PA-C  Patient coming from: Home  I have personally briefly reviewed patient's old medical records in [facility]  Chief Complaint: Flank pain, CP  HPI: Kimberly David is a 60 y.o. female with medical history significant of palpitations on BB, HLD, prior CP but coronary calcium score of 0 on CT in Dec.  Pt presents to ED with 2 day h/o R sided flank pain, constant, progressively worsening, associated N/V, fevers and chills.  Some L sided achy chest pain improved by NTG.  No SOB, no cough, is COVID vaccinated and had COVID-19.  No dysuria, does have lower abd pain and urinary frequency.   ED Course: UA c/w UTI.  Tm 103.x, COVID and flu neg, CXR neg.  WBC 11k.  HR 120s.  Started on rocephin.   Review of Systems: As per HPI, otherwise all review of systems negative.  Past Medical History:  Diagnosis Date  . Abnormal blood chemistry 10/27/2019  . Angina pectoris (HCC) 01/30/2020  . Anxiety   . Cardiac murmur 05/09/2019  . COVID-19   . Depression   . Depression, major, single episode, moderate (HCC) 05/06/2019  . Easy bruising 10/27/2019  . Eustachian tube disorder 12/23/2019  . Ganglion cyst of wrist    Rt  . History of severe acute respiratory syndrome coronavirus 2 (SARS-CoV-2) disease 03/02/2019  . Hyperlipidemia    takes lipitor  . Mixed hyperlipidemia 05/06/2019  . Moderate recurrent major depression (HCC) 06/16/2019  . Need for tetanus, diphtheria, and acellular pertussis (Tdap) vaccine 03/17/2020  . Other fatigue 07/16/2019  . Otitis media 12/23/2019  . Palpitation 05/06/2019  . Palpitations 05/09/2019    Past Surgical History:  Procedure Laterality Date  . CESAREAN SECTION  02/08/2001  . GANGLION CYST EXCISION  01/11/2012   Procedure: REMOVAL GANGLION OF WRIST;  Surgeon: Tami Ribas, MD;  Location: [facility];  Service:  Orthopedics;  Laterality: Right;  right wrist excision mass      reports that she has never smoked. She has never used smokeless tobacco. She reports that she does not drink alcohol and does not use drugs.  Allergies  Allergen Reactions  . Codeine Nausea And Vomiting    Family History  Problem Relation Age of Onset  . Hyperlipidemia Mother   . Heart attack Mother   . Migraines Mother   . Lung cancer Father   . Diabetes type II Sister   . Migraines Sister   . Diabetes type I Daughter      Prior to Admission medications   Medication Sig Start Date End Date Taking? Authorizing Provider  albuterol (VENTOLIN HFA) 108 (90 Base) MCG/ACT inhaler Inhale 1-2 puffs into the lungs every 4 (four) hours as needed for shortness of breath.   Yes [provider]  ALPRAZolam (XANAX) 0.25 MG tablet TAKE 1 TABLET BY MOUTH EVERY DAY AT BEDTIME AS NEEDED Patient taking differently: Take 0.25 mg by mouth at bedtime as needed for anxiety or sleep. 04/30/20  Yes Claryce Sofia, PA-C  aspirin EC 81 MG tablet Take 1 tablet (81 mg total) by mouth daily. Swallow whole. 01/30/20  Yes Revankar, Aundra Dubin, MD  atorvastatin (LIPITOR) 20 MG tablet TAKE 1 TABLET BY MOUTH EVERY DAY Patient taking differently: Take 20 mg by mouth daily. 02/23/20  Yes Jaleena Sofia, PA-C  FYAVOLV 1-5 MG-MCG TABS tablet Take 1 tablet by mouth daily. 03/14/20  Yes [provider]  meclizine (ANTIVERT) 25 MG tablet TAKE 1 TABLET EVERY 6 HOURS AS NEEDED FOR DIZZINESS Patient taking differently: Take 25 mg by mouth daily. 03/24/20  Yes Cox, Kirsten, MD  metoprolol succinate (TOPROL-XL) 50 MG 24 hr tablet TAKE 1 TABLET BY MOUTH EVERYDAY AT BEDTIME Patient taking differently: Take 50 mg by mouth at bedtime. 05/05/20  Yes Anne-Marie Sofia, PA-C  nitroGLYCERIN (NITROSTAT) 0.4 MG SL tablet Place 0.4 mg under the tongue every 5 (five) minutes as needed for chest pain.   Yes [provider]  tolterodine (DETROL LA) 2 MG 24 hr capsule  Take 2 mg by mouth daily.   Yes [provider]  venlafaxine XR (EFFEXOR-XR) 150 MG 24 hr capsule TAKE 1 CAPSULE BY MOUTH DAILY WITH BREAKFAST. Patient taking differently: Take 150 mg by mouth daily with breakfast. 04/08/20  Yes Mandeep Sofia, PA-C    Physical Exam: Vitals:   05/26/20 1715 05/26/20 1815 05/26/20 1916 05/26/20 2015  BP: 138/71 135/68  139/80  Pulse: (!) 106 (!) 109  (!) 120  Resp: 20 20  (!) 24  Temp:      TempSrc:      SpO2: 98% 97%  94%  Weight:   56.9 kg   Height:   5\' 1"  (1.549 m)     Constitutional: Ill appearing Eyes: PERRL, lids and conjunctivae normal ENMT: Mucous membranes are moist. Posterior pharynx clear of any exudate or lesions.Normal dentition.  Neck: normal, supple, no masses, no thyromegaly Respiratory: clear to auscultation bilaterally, no wheezing, no crackles. Normal respiratory effort. No accessory muscle use.  Cardiovascular: Regular rate and rhythm, no murmurs / rubs / gallops. No extremity edema. 2+ pedal pulses. No carotid bruits.  Abdomen: no tenderness, no masses palpated. No hepatosplenomegaly. Bowel sounds positive.  Musculoskeletal: no clubbing / cyanosis. No joint deformity upper and lower extremities. Good ROM, no contractures. Normal muscle tone.  Skin: no rashes, lesions, ulcers. No induration Neurologic: CN 2-12 grossly intact. Sensation intact, DTR normal. Strength 5/5 in all 4.  Psychiatric: Normal judgment and insight. Alert and oriented x 3. Normal mood.    Labs on Admission: I have personally reviewed following labs and imaging studies  CBC: Recent Labs  Lab 05/26/20 1317  WBC 12.4*  HGB 12.4  HCT 35.8*  MCV 92.7  PLT 268   Basic Metabolic Panel: Recent Labs  Lab 05/26/20 1318  NA 134*  K 3.3*  CL 102  CO2 21*  GLUCOSE 182*  BUN 20  CREATININE 0.93  CALCIUM 9.1   GFR: Estimated Creatinine Clearance: 48.5 mL/min (by C-G formula based on SCr of 0.93 mg/dL). Liver Function Tests: Recent Labs  Lab  05/26/20 1318  AST 25  ALT 18  ALKPHOS 82  BILITOT 0.8  PROT 6.7  ALBUMIN 3.6   Recent Labs  Lab 05/26/20 1318  LIPASE 36   No results for input(s): AMMONIA in the last 168 hours. Coagulation Profile: Recent Labs  Lab 05/26/20 1534  INR 1.1   Cardiac Enzymes: No results for input(s): CKTOTAL, CKMB, CKMBINDEX, TROPONINI in the last 168 hours. BNP (last 3 results) No results for input(s): PROBNP in the last 8760 hours. HbA1C: No results for input(s): HGBA1C in the last 72 hours. CBG: No results for input(s): GLUCAP in the last 168 hours. Lipid Profile: No results for input(s): CHOL, HDL, LDLCALC, TRIG, CHOLHDL, LDLDIRECT in the last 72 hours. Thyroid Function Tests: No results for input(s): TSH, T4TOTAL, FREET4, T3FREE, THYROIDAB in the last 72  hours. Anemia Panel: No results for input(s): VITAMINB12, FOLATE, FERRITIN, TIBC, IRON, RETICCTPCT in the last 72 hours. Urine analysis:    Component Value Date/Time   COLORURINE YELLOW 05/26/2020 1318   APPEARANCEUR HAZY (A) 05/26/2020 1318   LABSPEC 1.008 05/26/2020 1318   PHURINE 6.0 05/26/2020 1318   GLUCOSEU NEGATIVE 05/26/2020 1318   HGBUR MODERATE (A) 05/26/2020 1318   BILIRUBINUR NEGATIVE 05/26/2020 1318   KETONESUR 5 (A) 05/26/2020 1318   PROTEINUR NEGATIVE 05/26/2020 1318   NITRITE POSITIVE (A) 05/26/2020 1318   LEUKOCYTESUR LARGE (A) 05/26/2020 1318    Radiological Exams on Admission: DG Chest 2 View  Result Date: 05/26/2020 CLINICAL DATA:  Chest pain EXAM: CHEST - 2 VIEW COMPARISON:  None. FINDINGS: The heart size and mediastinal contours are within normal limits. Both lungs are clear. The visualized skeletal structures are unremarkable. IMPRESSION: No active cardiopulmonary disease. Electronically Signed   By: Guadlupe SpanishPraneil  Patel M.D.   On: 05/26/2020 13:59   CT Renal Stone Study  Result Date: 05/26/2020 CLINICAL DATA:  Acute right flank pain. EXAM: CT ABDOMEN AND PELVIS WITHOUT CONTRAST TECHNIQUE: Multidetector  CT imaging of the abdomen and pelvis was performed following the standard protocol without IV contrast. COMPARISON:  None. FINDINGS: Lower chest: No acute abnormality. Hepatobiliary: No focal liver abnormality is seen. No gallstones, gallbladder wall thickening, or biliary dilatation. Pancreas: Unremarkable. No pancreatic ductal dilatation or surrounding inflammatory changes. Spleen: Normal in size without focal abnormality. Adrenals/Urinary Tract: Adrenal glands are unremarkable. Kidneys are normal, without renal calculi, focal lesion, or hydronephrosis. Bladder is unremarkable. Stomach/Bowel: Stomach is within normal limits. Appendix appears normal. No evidence of bowel wall thickening, distention, or inflammatory changes. Vascular/Lymphatic: No significant vascular findings are present. No enlarged abdominal or pelvic lymph nodes. Reproductive: Uterus and bilateral adnexa are unremarkable. Other: No abdominal wall hernia or abnormality. No abdominopelvic ascites. Musculoskeletal: No acute or significant osseous findings. IMPRESSION: No abnormality seen in the abdomen or pelvis. Electronically Signed   By: Lupita RaiderJames  Green Jr M.D.   On: 05/26/2020 18:51    EKG: Independently reviewed.  Assessment/Plan Principal Problem:   Acute pyelonephritis Active Problems:   Mixed hyperlipidemia   Sepsis (HCC)    1. Acute pyelonephritis and sepsis - 1. Got sepsis IVF bolus in ED 2. UCx and BCx pending 3. Rocephin 4. Will hold metoprolol in setting of sepsis 5. Lactate nl 6. Normotensive 2. HLD - 1. Cont statin  DVT prophylaxis: Lovenox Code Status: Full Family Communication: No family in room Disposition Plan: Home after sepsis resolved / UTI improved Consults called: None Admission status: Place in obs    Elaura Calix M. DO [facility]  How to contact the [facility] Attending or Consulting provider 7A - 7P or covering provider during after hours 7P -7A, for this patient?  1. Check the care  team in [facility] and look for a) attending/consulting [facility] provider listed and b) the [facility] team listed 2. Log into [email or web address]  Amion Physician Scheduling and messaging for groups and whole hospitals  On call and physician scheduling software for group practices, residents, hospitalists and other medical providers for call, clinic, rotation and shift schedules. On[employer] is a hospital-wide system for scheduling doctors and paging doctors on call. EasyPlot is for scientific plotting and data analysis.  [email or web address]  and use [place]'s universal password to access. If you do not have the password, please contact the hospital operator.  3. Locate the [facility] provider you are looking for under [facility] and page to  a number that you can be directly reached. 4. If you still have difficulty reaching the provider, please page the [facility] (Director on Call) for the Hospitalists listed on amion for assistance.  05/26/2020, 10:15 PM

## 2020-05-26 NOTE — ED Triage Notes (Signed)
Patient complains of left sided chest pain and right flank pain that started yesterday suddenly while at work and has gotten progressively worse. Patient alert, oriented, and in no apparent distress.

## 2020-05-26 NOTE — ED Notes (Signed)
Informed both triage nurse of pt vitals

## 2020-05-26 NOTE — Sepsis Progress Note (Signed)
eLink is monitoring this Code Sepsis. °

## 2020-05-26 NOTE — ED Provider Notes (Signed)
[facility] EMERGENCY DEPARTMENT Provider Note   CSN: [id number] Arrival date & time: 05/26/20  1300     History Chief Complaint  Patient presents with  . Chest Pain  . Flank Pain    Kimberly David is a 60 y.o. female.  HPI Patient is a 60 year old female with past medical history of heart palpitations, angina, HLD, palpitations on beta-blocker  Patient is presented today with 2 days of right-sided flank pain that she states has been constant progressively worsening associated nausea vomiting fevers and chills.  She states that she has some left-sided chest pain as well which is achy.  She states that she took some nitroglycerin which she was prescribed for her angina which seemed to improve her symptoms.  She denies any left arm pain or radiation of the chest pain to her back or shoulder or neck.  She denies any shortness of breath cough or congestion.  She states that she is vaccinated for COVID-19 and has already had COVID-19 and recovered  She denies any urinary dysuria but states that she does have some frequency and lower abdominal pain as well.  No other associate symptoms.  No aggravating / mitigating factors.  No recent surgeries, hospitalization, long travel, hemoptysis, estrogen containing OCP, cancer history.  No unilateral leg swelling.  No history of PE or VTE.     Past Medical History:  Diagnosis Date  . Abnormal blood chemistry 10/27/2019  . Angina pectoris (HCC) 01/30/2020  . Anxiety   . Cardiac murmur 05/09/2019  . COVID-19   . Depression   . Depression, major, single episode, moderate (HCC) 05/06/2019  . Easy bruising 10/27/2019  . Eustachian tube disorder 12/23/2019  . Ganglion cyst of wrist    Rt  . History of severe acute respiratory syndrome coronavirus 2 (SARS-CoV-2) disease 03/02/2019  . Hyperlipidemia    takes lipitor  . Mixed hyperlipidemia 05/06/2019  . Moderate recurrent major depression (HCC) 06/16/2019  . Need for  tetanus, diphtheria, and acellular pertussis (Tdap) vaccine 03/17/2020  . Other fatigue 07/16/2019  . Otitis media 12/23/2019  . Palpitation 05/06/2019  . Palpitations 05/09/2019    Patient Active Problem List   Diagnosis Date Noted  . Acute pyelonephritis 05/26/2020  . Sepsis (HCC) 05/26/2020  . PFO (patent foramen ovale) 05/26/2020  . Need for tetanus, diphtheria, and acellular pertussis (Tdap) vaccine 03/17/2020  . Angina pectoris (HCC) 01/30/2020  . COVID-19   . Depression   . Ganglion cyst of wrist   . Hyperlipidemia   . Otitis media 12/23/2019  . Eustachian tube disorder 12/23/2019  . Easy bruising 10/27/2019  . Abnormal blood chemistry 10/27/2019  . Other fatigue 07/16/2019  . Moderate recurrent major depression (HCC) 06/16/2019  . Palpitations 05/09/2019  . Cardiac murmur 05/09/2019  . Mixed hyperlipidemia 05/06/2019  . Palpitation 05/06/2019  . Depression, major, single episode, moderate (HCC) 05/06/2019  . Anxiety 05/06/2019  . History of severe acute respiratory syndrome coronavirus 2 (SARS-CoV-2) disease 03/02/2019    Past Surgical History:  Procedure Laterality Date  . CESAREAN SECTION  02/08/2001  . GANGLION CYST EXCISION  01/11/2012   Procedure: REMOVAL GANGLION OF WRIST;  Surgeon: Tami Ribas, MD;  Location: [facility];  Service: Orthopedics;  Laterality: Right;  right wrist excision mass      OB History   No obstetric history on file.     Family History  Problem Relation Age of Onset  . Hyperlipidemia Mother   . Heart attack Mother   .  Migraines Mother   . Lung cancer Father   . Diabetes type II Sister   . Migraines Sister   . Diabetes type I Daughter     Social History   Tobacco Use  . Smoking status: Never Smoker  . Smokeless tobacco: Never Used  Vaping Use  . Vaping Use: Never used  Substance Use Topics  . Alcohol use: No  . Drug use: No    Home Medications Prior to Admission medications   Medication Sig Start Date  End Date Taking? Authorizing Provider  albuterol (VENTOLIN HFA) 108 (90 Base) MCG/ACT inhaler Inhale 1-2 puffs into the lungs every 4 (four) hours as needed.    [provider]  ALPRAZolam Prudy Feeler(XANAX) 0.25 MG tablet TAKE 1 TABLET BY MOUTH EVERY DAY AT BEDTIME AS NEEDED 04/30/20   Marlean Sofiaavis, Sara, PA-C  aspirin EC 81 MG tablet Take 1 tablet (81 mg total) by mouth daily. Swallow whole. 01/30/20   Revankar, Aundra Dubinajan R, MD  atorvastatin (LIPITOR) 20 MG tablet TAKE 1 TABLET BY MOUTH EVERY DAY 02/23/20   Aela Sofiaavis, Sara, PA-C  FYAVOLV 1-5 MG-MCG TABS tablet Take 1 tablet by mouth daily. 03/14/20   [provider]  meclizine (ANTIVERT) 25 MG tablet TAKE 1 TABLET EVERY 6 HOURS AS NEEDED FOR DIZZINESS 03/24/20   Cox, Fritzi MandesKirsten, MD  metoprolol succinate (TOPROL-XL) 50 MG 24 hr tablet TAKE 1 TABLET BY MOUTH EVERYDAY AT BEDTIME 05/05/20   Kodi Sofiaavis, Sara, PA-C  nitroGLYCERIN (NITROSTAT) 0.4 MG SL tablet Place 0.4 mg under the tongue every 5 (five) minutes as needed for chest pain.    [provider]  tolterodine (DETROL LA) 2 MG 24 hr capsule Take 2 mg by mouth daily.    [provider]  venlafaxine XR (EFFEXOR-XR) 150 MG 24 hr capsule TAKE 1 CAPSULE BY MOUTH DAILY WITH BREAKFAST. 04/08/20   Malayasia Sofiaavis, Sara, PA-C    Allergies    Codeine  Review of Systems   Review of Systems  Constitutional: Positive for chills, fatigue and fever.  HENT: Negative for congestion.   Eyes: Negative for pain.  Respiratory: Negative for cough and shortness of breath.   Cardiovascular: Negative for chest pain and leg swelling.  Gastrointestinal: Positive for abdominal pain, nausea and vomiting. Negative for constipation and diarrhea.  Genitourinary: Positive for flank pain and frequency. Negative for dysuria, hematuria and vaginal pain.  Musculoskeletal: Negative for myalgias.  Skin: Negative for rash.  Neurological: Negative for dizziness and headaches.    Physical Exam Updated Vital Signs BP 139/80   Pulse (!)  120   Temp 98.4 F (36.9 C) (Oral)   Resp (!) 24   Ht 5\' 1"  (1.549 m)   Wt 56.9 kg   SpO2 94%   BMI 23.70 kg/m   Physical Exam Vitals and nursing note reviewed.  Constitutional:      General: She is in acute distress.     Comments: Patient is acutely uncomfortable appearing mildly diaphoretic 60 year old female appears to be in distress.  HENT:     Head: Normocephalic and atraumatic.     Nose: Nose normal.  Eyes:     General: No scleral icterus. Cardiovascular:     Rate and Rhythm: Normal rate and regular rhythm.     Pulses: Normal pulses.     Heart sounds: Normal heart sounds.     Comments: Tachycardia up to 120 with movement.  110 at rest. Pulmonary:     Effort: Pulmonary effort is normal. No respiratory distress.  Breath sounds: No wheezing.  Abdominal:     Palpations: Abdomen is soft.     Tenderness: There is abdominal tenderness.     Comments: Suprapubic tenderness present.  Right CVA tenderness. No other significant abdominal tenderness.  No guarding or rebound on examination.  Musculoskeletal:     Cervical back: Normal range of motion.     Right lower leg: No edema.     Left lower leg: No edema.     Comments: No lower extremity edema or calf tenderness.  Skin:    General: Skin is warm and dry.     Capillary Refill: Capillary refill takes less than 2 seconds.  Neurological:     Mental Status: She is alert. Mental status is at baseline.  Psychiatric:        Mood and Affect: Mood normal.        Behavior: Behavior normal.     ED Results / Procedures / Treatments   Labs (all labs ordered are listed, but only abnormal results are displayed) Labs Reviewed  CBC - Abnormal; Notable for the following components:      Result Value   WBC 12.4 (*)    RBC 3.86 (*)    HCT 35.8 (*)    All other components within normal limits  COMPREHENSIVE METABOLIC PANEL - Abnormal; Notable for the following components:   Sodium 134 (*)    Potassium 3.3 (*)    CO2 21 (*)     Glucose, Bld 182 (*)    All other components within normal limits  URINALYSIS, ROUTINE W REFLEX MICROSCOPIC - Abnormal; Notable for the following components:   APPearance HAZY (*)    Hgb urine dipstick MODERATE (*)    Ketones, ur 5 (*)    Nitrite POSITIVE (*)    Leukocytes,Ua LARGE (*)    WBC, UA >50 (*)    Bacteria, UA MANY (*)    All other components within normal limits  RESP PANEL BY RT-PCR (FLU A&B, COVID) ARPGX2  CULTURE, BLOOD (ROUTINE X 2)  CULTURE, BLOOD (ROUTINE X 2)  URINE CULTURE  LIPASE, BLOOD  LACTIC ACID, PLASMA  PROTIME-INR  APTT  LACTIC ACID, PLASMA  HIV ANTIBODY (ROUTINE TESTING W REFLEX)  CBC  BASIC METABOLIC PANEL  TROPONIN I (HIGH SENSITIVITY)  TROPONIN I (HIGH SENSITIVITY)    EKG EKG Interpretation  Date/Time:  Wednesday May 26 2020 13:19:58 EDT Ventricular Rate:  130 PR Interval:  142 QRS Duration: 72 QT Interval:  292 QTC Calculation: 429 R Axis:   78 Text Interpretation: Sinus tachycardia Cannot rule out Anterior infarct , age undetermined Abnormal ECG No previous ECGs available Confirmed by Richardean Canal ([id number]) on 05/26/2020 3:14:21 PM   Radiology DG Chest 2 View  Result Date: 05/26/2020 CLINICAL DATA:  Chest pain EXAM: CHEST - 2 VIEW COMPARISON:  None. FINDINGS: The heart size and mediastinal contours are within normal limits. Both lungs are clear. The visualized skeletal structures are unremarkable. IMPRESSION: No active cardiopulmonary disease. Electronically Signed   By: Guadlupe Spanish M.D.   On: 05/26/2020 13:59   CT Renal Stone Study  Result Date: 05/26/2020 CLINICAL DATA:  Acute right flank pain. EXAM: CT ABDOMEN AND PELVIS WITHOUT CONTRAST TECHNIQUE: Multidetector CT imaging of the abdomen and pelvis was performed following the standard protocol without IV contrast. COMPARISON:  None. FINDINGS: Lower chest: No acute abnormality. Hepatobiliary: No focal liver abnormality is seen. No gallstones, gallbladder wall thickening, or biliary  dilatation. Pancreas: Unremarkable. No pancreatic ductal dilatation or  surrounding inflammatory changes. Spleen: Normal in size without focal abnormality. Adrenals/Urinary Tract: Adrenal glands are unremarkable. Kidneys are normal, without renal calculi, focal lesion, or hydronephrosis. Bladder is unremarkable. Stomach/Bowel: Stomach is within normal limits. Appendix appears normal. No evidence of bowel wall thickening, distention, or inflammatory changes. Vascular/Lymphatic: No significant vascular findings are present. No enlarged abdominal or pelvic lymph nodes. Reproductive: Uterus and bilateral adnexa are unremarkable. Other: No abdominal wall hernia or abnormality. No abdominopelvic ascites. Musculoskeletal: No acute or significant osseous findings. IMPRESSION: No abnormality seen in the abdomen or pelvis. Electronically Signed   By: Lupita Raider M.D.   On: 05/26/2020 18:51    Procedures .Critical Care Performed by: Gailen Shelter, PA Authorized by: Gailen Shelter, PA   Critical care provider statement:    Critical care time (minutes):  35   Critical care time was exclusive of:  Separately billable procedures and treating other patients and teaching time   Critical care was necessary to treat or prevent imminent or life-threatening deterioration of the following conditions:  Sepsis   Critical care was time spent personally by me on the following activities:  Discussions with consultants, evaluation of patient's response to treatment, examination of patient, review of old charts, re-evaluation of patient's condition, pulse oximetry, ordering and review of radiographic studies, ordering and review of laboratory studies and ordering and performing treatments and interventions   I assumed direction of critical care for this patient from another provider in my specialty: no       Medications Ordered in ED Medications  lactated ringers infusion ( Intravenous New Bag/Given 05/26/20 1803)   cefTRIAXone (ROCEPHIN) 1 g in sodium chloride 0.9 % 100 mL IVPB (has no administration in time range)  potassium chloride SA (KLOR-CON) CR tablet 20 mEq (has no administration in time range)  enoxaparin (LOVENOX) injection 40 mg (has no administration in time range)  acetaminophen (TYLENOL) tablet 650 mg (has no administration in time range)    Or  acetaminophen (TYLENOL) suppository 650 mg (has no administration in time range)  ondansetron (ZOFRAN) tablet 4 mg (has no administration in time range)    Or  ondansetron (ZOFRAN) injection 4 mg (has no administration in time range)  HYDROcodone-acetaminophen (NORCO/VICODIN) 5-325 MG per tablet 1-2 tablet (has no administration in time range)  acetaminophen (TYLENOL) tablet 650 mg (650 mg Oral Given 05/26/20 1318)  lactated ringers bolus 1,000 mL (0 mLs Intravenous Stopped 05/26/20 1802)    And  lactated ringers bolus 500 mL (0 mLs Intravenous Stopped 05/26/20 1802)    And  lactated ringers bolus 250 mL (0 mLs Intravenous Stopped 05/26/20 1802)  cefTRIAXone (ROCEPHIN) 2 g in sodium chloride 0.9 % 100 mL IVPB (0 g Intravenous Stopped 05/26/20 1654)  HYDROmorphone (DILAUDID) injection 0.5 mg (0.5 mg Intravenous Given 05/26/20 1553)  ondansetron (ZOFRAN) injection 4 mg (4 mg Intravenous Given 05/26/20 1553)  acetaminophen (TYLENOL) tablet 1,000 mg (1,000 mg Oral Given 05/26/20 2001)  ketorolac (TORADOL) 15 MG/ML injection 15 mg (15 mg Intravenous Given 05/26/20 2001)    ED Course  I have reviewed the triage vital signs and the nursing notes.  Pertinent labs & imaging results that were available during my care of the patient were reviewed by me and considered in my medical decision making (see chart for details).  Clinical Course as of 05/26/20 2023  Wed May 26, 2020  1525 Pt initially seen at approx 3:30 and initiated code sepsis w/ rocephin and 34mL/kg bolus of fluids.  Patient  significantly tachycardic, hypothermic and with leukocytosis.  Meet  sepsis criteria.  Urinalysis pending at this time.  I have strong suspicion for pyelonephritis versus kidney stone perhaps infected ureterolithiasis. [WF]  2008 Discussed with Dr. Julian Reil of hospitalist who will admit.  [WF]  2021 Recheck temp personally 102.7 -- ketor + tylenol given [WF]    Clinical Course User Index [WF] Gailen Shelter, [place]     Patient is 60 year old female presented today for right flank pain with associated nausea and vomiting fevers and chills.  She also has some left chest pain that is nonradiating nonexertional nonpleuritic.  I have low suspicion for pneumonia, pulmonary embolism, ACS.  I suspect that some of her anginal chest pain that she is describing is likely secondary to some demand given her significant tachycardia.  I suspect pyelonephritis given her examination.  Urinalysis shows moderate hemoglobin leukocytes bacteria and nitrate positive urine.  CT abdomen pelvis without contrast obtained to evaluate for nephrolithiasis/ureterolithiasis is is negative.  CMP with mild hyperglycemia and mild hypokalemia and hyponatremia no indication for admission on this.  Will rehydrate with fluids per sepsis protocol.  CBC with leukocytosis no significant anemia.  Coags within normal limits, Covid test negative for Covid and flu.  Cultures pending for urine and blood.  Lennan Malone was evaluated in Emergency Department on 05/26/2020 for the symptoms described in the history of present illness. She was evaluated in the context of the global COVID-19 pandemic, which necessitated consideration that the patient might be at risk for infection with the SARS-CoV-2 virus that causes COVID-19. Institutional protocols and algorithms that pertain to the evaluation of patients at risk for COVID-19 are in a state of rapid change based on information released by regulatory bodies including the CDC and federal and state organizations. These policies and algorithms were followed  during the patient's care in the ED.   MDM Rules/Calculators/A&P                          Final Clinical Impression(s) / ED Diagnoses Final diagnoses:  None    Rx / DC Orders ED Discharge Orders    None       Gailen Shelter, [place] 05/26/20 2023    Charlynne Pander, MD 05/28/20 1459

## 2020-05-27 ENCOUNTER — Other Ambulatory Visit: Payer: Self-pay

## 2020-05-27 DIAGNOSIS — R002 Palpitations: Secondary | ICD-10-CM | POA: Diagnosis present

## 2020-05-27 DIAGNOSIS — N10 Acute tubulo-interstitial nephritis: Secondary | ICD-10-CM | POA: Diagnosis present

## 2020-05-27 DIAGNOSIS — F419 Anxiety disorder, unspecified: Secondary | ICD-10-CM | POA: Diagnosis present

## 2020-05-27 DIAGNOSIS — Z83438 Family history of other disorder of lipoprotein metabolism and other lipidemia: Secondary | ICD-10-CM | POA: Diagnosis not present

## 2020-05-27 DIAGNOSIS — R112 Nausea with vomiting, unspecified: Secondary | ICD-10-CM | POA: Diagnosis present

## 2020-05-27 DIAGNOSIS — A4151 Sepsis due to Escherichia coli [E. coli]: Secondary | ICD-10-CM | POA: Diagnosis present

## 2020-05-27 DIAGNOSIS — E782 Mixed hyperlipidemia: Secondary | ICD-10-CM | POA: Diagnosis present

## 2020-05-27 DIAGNOSIS — Z20822 Contact with and (suspected) exposure to covid-19: Secondary | ICD-10-CM | POA: Diagnosis present

## 2020-05-27 DIAGNOSIS — F331 Major depressive disorder, recurrent, moderate: Secondary | ICD-10-CM | POA: Diagnosis present

## 2020-05-27 DIAGNOSIS — R6883 Chills (without fever): Secondary | ICD-10-CM | POA: Diagnosis present

## 2020-05-27 DIAGNOSIS — Z79899 Other long term (current) drug therapy: Secondary | ICD-10-CM | POA: Diagnosis not present

## 2020-05-27 DIAGNOSIS — Z801 Family history of malignant neoplasm of trachea, bronchus and lung: Secondary | ICD-10-CM | POA: Diagnosis not present

## 2020-05-27 DIAGNOSIS — Z885 Allergy status to narcotic agent status: Secondary | ICD-10-CM | POA: Diagnosis not present

## 2020-05-27 DIAGNOSIS — Z8249 Family history of ischemic heart disease and other diseases of the circulatory system: Secondary | ICD-10-CM | POA: Diagnosis not present

## 2020-05-27 DIAGNOSIS — Z7982 Long term (current) use of aspirin: Secondary | ICD-10-CM | POA: Diagnosis not present

## 2020-05-27 DIAGNOSIS — N12 Tubulo-interstitial nephritis, not specified as acute or chronic: Secondary | ICD-10-CM | POA: Diagnosis present

## 2020-05-27 DIAGNOSIS — Z833 Family history of diabetes mellitus: Secondary | ICD-10-CM | POA: Diagnosis not present

## 2020-05-27 DIAGNOSIS — Z8616 Personal history of COVID-19: Secondary | ICD-10-CM | POA: Diagnosis not present

## 2020-05-27 LAB — CBC
HCT: 36.6 % (ref 36.0–46.0)
Hemoglobin: 12.3 g/dL (ref 12.0–15.0)
MCH: 31.9 pg (ref 26.0–34.0)
MCHC: 33.6 g/dL (ref 30.0–36.0)
MCV: 95.1 fL (ref 80.0–100.0)
Platelets: 223 10*3/uL (ref 150–400)
RBC: 3.85 MIL/uL — ABNORMAL LOW (ref 3.87–5.11)
RDW: 13.2 % (ref 11.5–15.5)
WBC: 10.2 10*3/uL (ref 4.0–10.5)
nRBC: 0 % (ref 0.0–0.2)

## 2020-05-27 LAB — BASIC METABOLIC PANEL
Anion gap: 9 (ref 5–15)
BUN: 9 mg/dL (ref 6–20)
CO2: 23 mmol/L (ref 22–32)
Calcium: 8.6 mg/dL — ABNORMAL LOW (ref 8.9–10.3)
Chloride: 106 mmol/L (ref 98–111)
Creatinine, Ser: 0.71 mg/dL (ref 0.44–1.00)
GFR, Estimated: >60 mL/min (ref >60)
Glucose, Bld: 103 mg/dL — ABNORMAL HIGH (ref 70–99)
Potassium: 3.4 mmol/L — ABNORMAL LOW (ref 3.5–5.1)
Sodium: 138 mmol/L (ref 135–145)

## 2020-05-27 LAB — HIV ANTIBODY (ROUTINE TESTING W REFLEX): HIV Screen 4th Generation wRfx: NONREACTIVE

## 2020-05-27 MED ORDER — HYDROMORPHONE HCL 1 MG/ML IJ SOLN
0.5000 mg | Freq: Once | INTRAMUSCULAR | Status: AC
  Administered 2020-05-27: 0.5 mg via INTRAVENOUS
  Filled 2020-05-27: qty 0.5

## 2020-05-27 MED ORDER — LACTATED RINGERS IV SOLN: INTRAVENOUS | Status: AC

## 2020-05-27 MED ORDER — METOPROLOL SUCCINATE ER 50 MG PO TB24
50.0000 mg | ORAL_TABLET | Freq: Every day | ORAL | Status: DC
  Administered 2020-05-27 – 2020-05-28 (×2): 50 mg via ORAL
  Filled 2020-05-27: qty 1
  Filled 2020-05-27: qty 2

## 2020-05-27 NOTE — ED Notes (Signed)
Pt in room with husband, pt. Comfortably having Breakfast. No complaints and resting. Rn provided assistance to bedside commode.

## 2020-05-27 NOTE — Progress Notes (Signed)
PROGRESS NOTE    Kimberly David  SLH:[id number] DOB: 05-30-1960 DOA: 05/26/2020 PCP: Envi Sofia, PA-C    Brief Narrative:  60 year old female with history of palpitations on beta-blockers, hyperlipidemia but no other significant medical issues presented to the ER with 2 days history of right-sided flank pain that was progressive, associated nausea vomiting and chills at home.  No shortness of breath.  No cough.  COVID-19 negative.  In the emergency room, temperature 103.  COVID-19 negative.  Chest x-ray normal.  WBC count 11,000.  Heart rate 120.  Urine cultures and blood cultures were drawn and admitted with Rocephin.   Assessment & Plan:   Principal Problem:   Acute pyelonephritis Active Problems:   Mixed hyperlipidemia   Sepsis (HCC)  Acute pyelonephritis/sepsis present on admission: Resuscitated with IV fluids.  Currently hemodynamically stable.  Continues to spike fever. Continue maintenance IV fluid.  Blood cultures urine cultures pending.  Currently on Rocephin that we will continue. CT scan of the abdomen pelvis with no obvious obstruction or hydronephrosis.  We will treat as uncomplicated pyelonephritis.  She will be able to go home on oral antibiotics once able to tolerate oral medications and afebrile. Blood pressures are adequate.  Will resume beta-blockers to avoid rebound tachycardia.  Hyperlipidemia: On a statin that she will continue.  Anxiety: Patient on venlafaxine but discontinued.   DVT prophylaxis: enoxaparin (LOVENOX) injection 40 mg Start: 05/26/20 2200   Code Status: Full code Family Communication: Husband at the bedside Disposition Plan: Status is: Observation  The patient will require care spanning > 2 midnights and should be moved to inpatient because: IV treatments appropriate due to intensity of illness or inability to take PO and Inpatient level of care appropriate due to severity of illness  Dispo: The patient is from: Home               Anticipated d/c is to: Home              Patient currently is not medically stable to d/c.   Difficult to place patient No         Consultants:   None  Procedures:   None  Antimicrobials:   Rocephin, 1 g daily 3/16---   Subjective: Patient seen and examined.  She was still in the emergency room.  Her husband was at the bedside.  Has some nausea but able to tolerate some food.  Temperature maximum 103.  Feels weak and has flank pain right more than left.  Has some suprapubic pain but denies any other complaints.  Objective: Vitals:   05/27/20 1315 05/27/20 1330 05/27/20 1345 05/27/20 1400  BP: (!) 151/73 (!) 150/81 140/77 (!) 142/76  Pulse: (!) 103 (!) 106 (!) 109 (!) 111  Resp: (!) 23 (!) 23 (!) 22 (!) 28  Temp:  99 F (37.2 C)    TempSrc:      SpO2: 97% 95% 93% 95%  Weight:      Height:        Intake/Output Summary (Last 24 hours) at 05/27/2020 1433 Last data filed at 05/27/2020 0732 Gross per 24 hour  Intake 1850 ml  Output 150 ml  Net 1700 ml   Filed Weights   05/26/20 1916  Weight: 56.9 kg    Examination:  General exam: Appears calm , sick looking with fever.  Patient is on room air. Respiratory system: Clear to auscultation. Respiratory effort normal.  No added sound. Cardiovascular system: S1 & S2 heard, tachycardic. Gastrointestinal system:  Mild suprapubic area.  No residual guarding.  Bowel sounds present. Central nervous system: Alert and oriented. No focal neurological deficits. Extremities: Symmetric 5 x 5 power. Skin: No rashes, lesions or ulcers Psychiatry: Judgement and insight appear normal. Mood & affect anxious.    Data Reviewed: I have personally reviewed following labs and imaging studies  CBC: Recent Labs  Lab 05/26/20 1317 05/27/20 0300  WBC 12.4* 10.2  HGB 12.4 12.3  HCT 35.8* 36.6  MCV 92.7 95.1  PLT 268 223   Basic Metabolic Panel: Recent Labs  Lab 05/26/20 1318 05/27/20 0300  NA 134* 138  K 3.3* 3.4*  CL 102  106  CO2 21* 23  GLUCOSE 182* 103*  BUN 20 9  CREATININE 0.93 0.71  CALCIUM 9.1 8.6*   GFR: Estimated Creatinine Clearance: 56.4 mL/min (by C-G formula based on SCr of 0.71 mg/dL). Liver Function Tests: Recent Labs  Lab 05/26/20 1318  AST 25  ALT 18  ALKPHOS 82  BILITOT 0.8  PROT 6.7  ALBUMIN 3.6   Recent Labs  Lab 05/26/20 1318  LIPASE 36   No results for input(s): AMMONIA in the last 168 hours. Coagulation Profile: Recent Labs  Lab 05/26/20 1534  INR 1.1   Cardiac Enzymes: No results for input(s): CKTOTAL, CKMB, CKMBINDEX, TROPONINI in the last 168 hours. BNP (last 3 results) No results for input(s): PROBNP in the last 8760 hours. HbA1C: No results for input(s): HGBA1C in the last 72 hours. CBG: No results for input(s): GLUCAP in the last 168 hours. Lipid Profile: No results for input(s): CHOL, HDL, LDLCALC, TRIG, CHOLHDL, LDLDIRECT in the last 72 hours. Thyroid Function Tests: No results for input(s): TSH, T4TOTAL, FREET4, T3FREE, THYROIDAB in the last 72 hours. Anemia Panel: No results for input(s): VITAMINB12, FOLATE, FERRITIN, TIBC, IRON, RETICCTPCT in the last 72 hours. Sepsis Labs: Recent Labs  Lab 05/26/20 1534  LATICACIDVEN 1.5    Recent Results (from the past 240 hour(s))  Blood Culture (routine x 2)     Status: None (Preliminary result)   Collection Time: 05/26/20  3:35 PM   Specimen: BLOOD  Result Value Ref Range Status   Specimen Description BLOOD SITE NOT SPECIFIED  Final   Special Requests   Final    BOTTLES DRAWN AEROBIC AND ANAEROBIC Blood Culture results may not be optimal due to an excessive volume of blood received in culture bottles   Culture   Final    NO GROWTH < 24 HOURS Performed at [facility], [address] [place], [place] [address]    Report Status PENDING  Incomplete  Blood Culture (routine x 2)     Status: None (Preliminary result)   Collection Time: 05/26/20  3:40 PM   Specimen: BLOOD  Result Value Ref  Range Status   Specimen Description BLOOD SITE NOT SPECIFIED  Final   Special Requests   Final    BOTTLES DRAWN AEROBIC ONLY Blood Culture adequate volume   Culture   Final    NO GROWTH < 24 HOURS Performed at [facility], [address], [place], [place] [address]    Report Status PENDING  Incomplete  Resp Panel by RT-PCR (Flu A&B, Covid) Nasopharyngeal Swab     Status: None   Collection Time: 05/26/20  4:00 PM   Specimen: Nasopharyngeal Swab; Nasopharyngeal(NP) swabs in vial transport medium  Result Value Ref Range Status   SARS Coronavirus 2 by RT PCR NEGATIVE NEGATIVE Final    Comment: (NOTE) SARS-CoV-2  target nucleic acids are NOT DETECTED.  The SARS-CoV-2 RNA is generally detectable in upper respiratory specimens during the acute phase of infection. The lowest concentration of SARS-CoV-2 viral copies this assay can detect is 138 copies/mL. A negative result does not preclude SARS-Cov-2 infection and should not be used as the sole basis for treatment or other patient management decisions. A negative result may occur with  improper specimen collection/handling, submission of specimen other than nasopharyngeal swab, presence of viral mutation(s) within the areas targeted by this assay, and inadequate number of viral copies(<138 copies/mL). A negative result must be combined with clinical observations, patient history, and epidemiological information. The expected result is Negative.  Fact Sheet for Patients:  BloggerCourse.com  Fact Sheet for Healthcare Providers:  SeriousBroker.it  This test is no t yet approved or cleared by the [place] FDA and  has been authorized for detection and/or diagnosis of SARS-CoV-2 by FDA under an Emergency Use Authorization (EUA). This EUA will remain  in effect (meaning this test can be used) for the duration of the COVID-19 declaration under Section 564(b)(1) of the Act,  21 U.S.C.section 360bbb-3(b)(1), unless the authorization is terminated  or revoked sooner.       Influenza A by PCR NEGATIVE NEGATIVE Final   Influenza B by PCR NEGATIVE NEGATIVE Final    Comment: (NOTE) The Xpert Xpress SARS-CoV-2/FLU/RSV plus assay is intended as an aid in the diagnosis of influenza from Nasopharyngeal swab specimens and should not be used as a sole basis for treatment. Nasal washings and aspirates are unacceptable for Xpert Xpress SARS-CoV-2/FLU/RSV testing.  Fact Sheet for Patients: BloggerCourse.com  Fact Sheet for Healthcare Providers: SeriousBroker.it  This test is not yet approved or cleared by the [place] FDA and has been authorized for detection and/or diagnosis of SARS-CoV-2 by FDA under an Emergency Use Authorization (EUA). This EUA will remain in effect (meaning this test can be used) for the duration of the COVID-19 declaration under Section 564(b)(1) of the Act, 21 U.S.C. section 360bbb-3(b)(1), unless the authorization is terminated or revoked.  Performed at [facility], [address] [place], [place] [address]          Radiology Studies: DG Chest 2 View  Result Date: 05/26/2020 CLINICAL DATA:  Chest pain EXAM: CHEST - 2 VIEW COMPARISON:  None. FINDINGS: The heart size and mediastinal contours are within normal limits. Both lungs are clear. The visualized skeletal structures are unremarkable. IMPRESSION: No active cardiopulmonary disease. Electronically Signed   By: Guadlupe Spanish M.D.   On: 05/26/2020 13:59   CT Renal Stone Study  Result Date: 05/26/2020 CLINICAL DATA:  Acute right flank pain. EXAM: CT ABDOMEN AND PELVIS WITHOUT CONTRAST TECHNIQUE: Multidetector CT imaging of the abdomen and pelvis was performed following the standard protocol without IV contrast. COMPARISON:  None. FINDINGS: Lower chest: No acute abnormality. Hepatobiliary: No focal liver abnormality is  seen. No gallstones, gallbladder wall thickening, or biliary dilatation. Pancreas: Unremarkable. No pancreatic ductal dilatation or surrounding inflammatory changes. Spleen: Normal in size without focal abnormality. Adrenals/Urinary Tract: Adrenal glands are unremarkable. Kidneys are normal, without renal calculi, focal lesion, or hydronephrosis. Bladder is unremarkable. Stomach/Bowel: Stomach is within normal limits. Appendix appears normal. No evidence of bowel wall thickening, distention, or inflammatory changes. Vascular/Lymphatic: No significant vascular findings are present. No enlarged abdominal or pelvic lymph nodes. Reproductive: Uterus and bilateral adnexa are unremarkable. Other: No abdominal wall hernia or abnormality. No abdominopelvic ascites. Musculoskeletal: No acute or significant osseous findings. IMPRESSION:  No abnormality seen in the abdomen or pelvis. Electronically Signed   By: Lupita RaiderJames  Green Jr M.D.   On: 05/26/2020 18:51        Scheduled Meds: . aspirin EC  81 mg Oral Daily  . atorvastatin  20 mg Oral Daily  . enoxaparin (LOVENOX) injection  40 mg Subcutaneous Q24H  . fesoterodine  4 mg Oral Daily  . meclizine  25 mg Oral Daily  . norethindrone-ethinyl estradiol  1 tablet Oral Daily  . venlafaxine XR  150 mg Oral Q breakfast   Continuous Infusions: . cefTRIAXone (ROCEPHIN)  IV       LOS: 0 days    Time spent: 30 minutes    Dorcas CarrowKuber Ghimire, MD [facility] Pager [phone]

## 2020-05-27 NOTE — ED Notes (Addendum)
Pt reporting 10/10 back pain and headache. Oral temp of 98.1 but axillary of 100.5. Pt is reporting cold chills. This RN gave 1 hydrocodone and PRN tylenol. Dr. Julian Reil aware.

## 2020-05-28 ENCOUNTER — Encounter (HOSPITAL_COMMUNITY): Payer: Self-pay | Admitting: Internal Medicine

## 2020-05-28 LAB — CBC WITH DIFFERENTIAL/PLATELET
Abs Immature Granulocytes: 0.03 10*3/uL (ref 0.00–0.07)
Basophils Absolute: 0 10*3/uL (ref 0.0–0.1)
Basophils Relative: 0 %
Eosinophils Absolute: 0.1 10*3/uL (ref 0.0–0.5)
Eosinophils Relative: 1 %
HCT: 33.1 % — ABNORMAL LOW (ref 36.0–46.0)
Hemoglobin: 11.3 g/dL — ABNORMAL LOW (ref 12.0–15.0)
Immature Granulocytes: 0 %
Lymphocytes Relative: 13 %
Lymphs Abs: 1.2 10*3/uL (ref 0.7–4.0)
MCH: 31.7 pg (ref 26.0–34.0)
MCHC: 34.1 g/dL (ref 30.0–36.0)
MCV: 92.7 fL (ref 80.0–100.0)
Monocytes Absolute: 1.1 10*3/uL — ABNORMAL HIGH (ref 0.1–1.0)
Monocytes Relative: 12 %
Neutro Abs: 7 10*3/uL (ref 1.7–7.7)
Neutrophils Relative %: 74 %
Platelets: 227 10*3/uL (ref 150–400)
RBC: 3.57 MIL/uL — ABNORMAL LOW (ref 3.87–5.11)
RDW: 12.5 % (ref 11.5–15.5)
WBC: 9.3 10*3/uL (ref 4.0–10.5)
nRBC: 0 % (ref 0.0–0.2)

## 2020-05-28 LAB — BASIC METABOLIC PANEL
Anion gap: 8 (ref 5–15)
BUN: 8 mg/dL (ref 6–20)
CO2: 27 mmol/L (ref 22–32)
Calcium: 8.6 mg/dL — ABNORMAL LOW (ref 8.9–10.3)
Chloride: 99 mmol/L (ref 98–111)
Creatinine, Ser: 0.62 mg/dL (ref 0.44–1.00)
GFR, Estimated: >60 mL/min (ref >60)
Glucose, Bld: 101 mg/dL — ABNORMAL HIGH (ref 70–99)
Potassium: 3.4 mmol/L — ABNORMAL LOW (ref 3.5–5.1)
Sodium: 134 mmol/L — ABNORMAL LOW (ref 135–145)

## 2020-05-28 LAB — PHOSPHORUS: Phosphorus: 2.8 mg/dL (ref 2.5–4.6)

## 2020-05-28 LAB — MAGNESIUM: Magnesium: 1.8 mg/dL (ref 1.7–2.4)

## 2020-05-28 MED ORDER — BUTALBITAL-APAP-CAFFEINE 50-325-40 MG PO TABS
1.0000 | ORAL_TABLET | ORAL | Status: DC | PRN
  Administered 2020-05-28: 1 via ORAL
  Filled 2020-05-28: qty 1

## 2020-05-28 MED ORDER — SODIUM CHLORIDE 0.9 % IV SOLN: 1.0000 g | Freq: Once | INTRAVENOUS | Status: DC

## 2020-05-28 MED ORDER — SODIUM CHLORIDE 0.9 % IV SOLN
1.0000 g | Freq: Once | INTRAVENOUS | Status: AC
  Administered 2020-05-28: 1 g via INTRAVENOUS
  Filled 2020-05-28 (×2): qty 10

## 2020-05-28 MED ORDER — POTASSIUM CHLORIDE CRYS ER 20 MEQ PO TBCR
40.0000 meq | EXTENDED_RELEASE_TABLET | Freq: Two times a day (BID) | ORAL | Status: DC
  Administered 2020-05-28: 40 meq via ORAL
  Filled 2020-05-28: qty 2

## 2020-05-28 MED ORDER — MAGNESIUM OXIDE 400 (241.3 MG) MG PO TABS
800.0000 mg | ORAL_TABLET | Freq: Two times a day (BID) | ORAL | Status: DC
  Administered 2020-05-28: 800 mg via ORAL
  Filled 2020-05-28: qty 2

## 2020-05-28 MED ORDER — CEPHALEXIN 500 MG PO CAPS: 500.0000 mg | ORAL_CAPSULE | Freq: Three times a day (TID) | ORAL | 0 refills | Status: AC

## 2020-05-28 NOTE — Progress Notes (Signed)
Patient discharging home. Vital signs stable at time of discharge as reflected in discharge summary. Discharge instructions given and verbal understanding returned. No questions at this time. Patient to follow up with PCP within a week.

## 2020-05-28 NOTE — Discharge Summary (Signed)
Physician Discharge Summary  Kimberly David [id number]:[id number] DOB: 1960/10/17 DOA: 05/26/2020  PCP: Woodie Sofia, PA-C  Admit date: 05/26/2020 Discharge date: 05/28/2020  Admitted From: home  Disposition:  Home   Recommendations for Outpatient Follow-up:  1. Follow up with PCP in 1-2 weeks   Home Health:NA  Equipment/Devices:NA   Discharge Condition: Stable CODE STATUS: Full code Diet recommendation: Regular diet.  Low-salt.  Discharge summary: 60 year old female with history of palpitations on beta-blockers, hyperlipidemia, no other significant medical problems presented to the ER with 2 days of right-sided flank pain that is progressive and associated with nausea vomiting and chills at home.  At the emergency room, temperature 103.  COVID-19 negative.  Chest x-ray normal.  WBC count 11,000.  Tachycardic with heart rate 120.  Urine cultures and blood cultures were drawn and patient was admitted and treated with Rocephin.  Acute pyelonephritis/UTI with E. Coli/sepsis present on admission: Treated with IV fluids, received Rocephin for 3 days including today.  Blood cultures negative.  Urine cultures growing E. coli, final sensitivity pending.  No history of drug-resistant.  With patient's clinical improvement, she will be discharged home on 4 more days of oral Keflex.  If final urine culture grows resistance to cephalosporin, will call in another prescription.  Patient had done good clinical recovery so hopefully her urine culture will be sensitive to cephalosporin. A CT scan abdomen pelvis on presentation was done, without any evidence of infected stone, hydronephrosis or a structural problem.   Patient to resume her long-term medication including statin and venlafaxine.  She is also on as needed benzodiazepine.  Electrolytes were replaced before discharge.   Discharge Diagnoses:  Principal Problem:   Acute pyelonephritis Active Problems:   Mixed hyperlipidemia   Sepsis (HCC)    Pyelonephritis    Discharge Instructions  Discharge Instructions    Diet - low sodium heart healthy   Complete by: As directed    Increase activity slowly   Complete by: As directed      Allergies as of 05/28/2020      Reactions   Codeine Nausea And Vomiting      Medication List    TAKE these medications   albuterol 108 (90 Base) MCG/ACT inhaler Commonly known as: VENTOLIN HFA Inhale 1-2 puffs into the lungs every 4 (four) hours as needed for shortness of breath.   ALPRAZolam 0.25 MG tablet Commonly known as: XANAX TAKE 1 TABLET BY MOUTH EVERY DAY AT BEDTIME AS NEEDED What changed: See the new instructions.   aspirin EC 81 MG tablet Take 1 tablet (81 mg total) by mouth daily. Swallow whole.   atorvastatin 20 MG tablet Commonly known as: LIPITOR TAKE 1 TABLET BY MOUTH EVERY DAY   cephALEXin 500 MG capsule Commonly known as: KEFLEX Take 1 capsule (500 mg total) by mouth 3 (three) times daily for 4 days.   Fyavolv 1-5 MG-MCG Tabs tablet Generic drug: norethindrone-ethinyl estradiol Take 1 tablet by mouth daily.   meclizine 25 MG tablet Commonly known as: ANTIVERT TAKE 1 TABLET EVERY 6 HOURS AS NEEDED FOR DIZZINESS What changed: See the new instructions.   metoprolol succinate 50 MG 24 hr tablet Commonly known as: TOPROL-XL TAKE 1 TABLET BY MOUTH EVERYDAY AT BEDTIME What changed: See the new instructions.   nitroGLYCERIN 0.4 MG SL tablet Commonly known as: NITROSTAT Place 0.4 mg under the tongue every 5 (five) minutes as needed for chest pain.   tolterodine 2 MG 24 hr capsule Commonly known as: DETROL LA Take  2 mg by mouth daily.   venlafaxine XR 150 MG 24 hr capsule Commonly known as: EFFEXOR-XR TAKE 1 CAPSULE BY MOUTH DAILY WITH BREAKFAST.       Follow-up Information    Oleva Sofia, PA-C Follow up in 2 week(s).   Specialty: Physician Assistant Contact information: [address] [place] [address] [phone]               Allergies  Allergen Reactions  . Codeine Nausea And Vomiting    Consultations:  None   Procedures/Studies: DG Chest 2 View  Result Date: 05/26/2020 CLINICAL DATA:  Chest pain EXAM: CHEST - 2 VIEW COMPARISON:  None. FINDINGS: The heart size and mediastinal contours are within normal limits. Both lungs are clear. The visualized skeletal structures are unremarkable. IMPRESSION: No active cardiopulmonary disease. Electronically Signed   By: Guadlupe Spanish M.D.   On: 05/26/2020 13:59   CT Renal Stone Study  Result Date: 05/26/2020 CLINICAL DATA:  Acute right flank pain. EXAM: CT ABDOMEN AND PELVIS WITHOUT CONTRAST TECHNIQUE: Multidetector CT imaging of the abdomen and pelvis was performed following the standard protocol without IV contrast. COMPARISON:  None. FINDINGS: Lower chest: No acute abnormality. Hepatobiliary: No focal liver abnormality is seen. No gallstones, gallbladder wall thickening, or biliary dilatation. Pancreas: Unremarkable. No pancreatic ductal dilatation or surrounding inflammatory changes. Spleen: Normal in size without focal abnormality. Adrenals/Urinary Tract: Adrenal glands are unremarkable. Kidneys are normal, without renal calculi, focal lesion, or hydronephrosis. Bladder is unremarkable. Stomach/Bowel: Stomach is within normal limits. Appendix appears normal. No evidence of bowel wall thickening, distention, or inflammatory changes. Vascular/Lymphatic: No significant vascular findings are present. No enlarged abdominal or pelvic lymph nodes. Reproductive: Uterus and bilateral adnexa are unremarkable. Other: No abdominal wall hernia or abnormality. No abdominopelvic ascites. Musculoskeletal: No acute or significant osseous findings. IMPRESSION: No abnormality seen in the abdomen or pelvis. Electronically Signed   By: Lupita Raider M.D.   On: 05/26/2020 18:51    (Echo, Carotid, EGD, Colonoscopy, ERCP)    Subjective: Patient seen and examined.  Still has some headache  otherwise no other complaints.  Eager to go home.  Temperature maximum 100 over last 24 hours.  Denies any nausea vomiting.  Flank pain is mostly improved.   Discharge Exam: Vitals:   05/28/20 0730 05/28/20 1430  BP: 132/81 118/73  Pulse: 92 91  Resp: 17 18  Temp: 98 F (36.7 C) 98.3 F (36.8 C)  SpO2: 96% 99%   Vitals:   05/28/20 0337 05/28/20 0400 05/28/20 0730 05/28/20 1430  BP: (!) 146/90  132/81 118/73  Pulse: 98 86 92 91  Resp: Temp: 98.6 F (37 C)  98 F (36.7 C) 98.3 F (36.8 C)  TempSrc: Oral  Oral Oral  SpO2: 94% 93% 96% 99%  Weight:      Height:        General: Pt is alert, awake, not in acute distress Cardiovascular: RRR, S1/S2 +, no rubs, no gallops Respiratory: CTA bilaterally, no wheezing, no rhonchi Abdominal: Soft, NT, ND, bowel sounds +, no costophrenic tenderness. Extremities: no edema, no cyanosis Walking around the room.  On room air.  Looks comfortable.    The results of significant diagnostics from this hospitalization (including imaging, microbiology, ancillary and laboratory) are listed below for reference.     Microbiology: Recent Results (from the past 240 hour(s))  Urine culture     Status: Abnormal (Preliminary result)   Collection Time: 05/26/20  3:13 PM   Specimen: In/Out Cath Urine  Result Value Ref Range Status   Specimen Description IN/OUT CATH URINE  Final   Special Requests NONE  Final   Culture (A)  Final    >=100,000 COLONIES/mL ESCHERICHIA COLI SUSCEPTIBILITIES TO FOLLOW Performed at [facility], [address] [place], [place] [address]    Report Status PENDING  Incomplete  Blood Culture (routine x 2)     Status: None (Preliminary result)   Collection Time: 05/26/20  3:35 PM   Specimen: BLOOD  Result Value Ref Range Status   Specimen Description BLOOD SITE NOT SPECIFIED  Final   Special Requests   Final    BOTTLES DRAWN AEROBIC AND ANAEROBIC Blood Culture results may not be optimal due to an  excessive volume of blood received in culture bottles   Culture   Final    NO GROWTH 2 DAYS Performed at [facility], [address] [place], [place] [address]    Report Status PENDING  Incomplete  Blood Culture (routine x 2)     Status: None (Preliminary result)   Collection Time: 05/26/20  3:40 PM   Specimen: BLOOD  Result Value Ref Range Status   Specimen Description BLOOD SITE NOT SPECIFIED  Final   Special Requests   Final    BOTTLES DRAWN AEROBIC ONLY Blood Culture adequate volume   Culture   Final    NO GROWTH 2 DAYS Performed at [facility], [address] [place], [place] [address]    Report Status PENDING  Incomplete  Resp Panel by RT-PCR (Flu A&B, Covid) Nasopharyngeal Swab     Status: None   Collection Time: 05/26/20  4:00 PM   Specimen: Nasopharyngeal Swab; Nasopharyngeal(NP) swabs in vial transport medium  Result Value Ref Range Status   SARS Coronavirus 2 by RT PCR NEGATIVE NEGATIVE Final    Comment: (NOTE) SARS-CoV-2 target nucleic acids are NOT DETECTED.  The SARS-CoV-2 RNA is generally detectable in upper respiratory specimens during the acute phase of infection. The lowest concentration of SARS-CoV-2 viral copies this assay can detect is 138 copies/mL. A negative result does not preclude SARS-Cov-2 infection and should not be used as the sole basis for treatment or other patient management decisions. A negative result may occur with  improper specimen collection/handling, submission of specimen other than nasopharyngeal swab, presence of viral mutation(s) within the areas targeted by this assay, and inadequate number of viral copies(<138 copies/mL). A negative result must be combined with clinical observations, patient history, and epidemiological information. The expected result is Negative.  Fact Sheet for Patients:  BloggerCourse.comhttps://www.fda.gov/media/152166/download  Fact Sheet for Healthcare Providers:   SeriousBroker.ithttps://www.fda.gov/media/152162/download  This test is no t yet approved or cleared by the [place] FDA and  has been authorized for detection and/or diagnosis of SARS-CoV-2 by FDA under an Emergency Use Authorization (EUA). This EUA will remain  in effect (meaning this test can be used) for the duration of the COVID-19 declaration under Section 564(b)(1) of the Act, 21 U.S.C.section 360bbb-3(b)(1), unless the authorization is terminated  or revoked sooner.       Influenza A by PCR NEGATIVE NEGATIVE Final   Influenza B by PCR NEGATIVE NEGATIVE Final    Comment: (NOTE) The Xpert Xpress SARS-CoV-2/FLU/RSV plus assay is intended as an aid in the diagnosis of influenza from Nasopharyngeal swab specimens and should not be used as a sole basis for treatment. Nasal washings and aspirates are unacceptable for Xpert Xpress SARS-CoV-2/FLU/RSV  testing.  Fact Sheet for Patients: BloggerCourse.com  Fact Sheet for Healthcare Providers: SeriousBroker.it  This test is not yet approved or cleared by the [place] FDA and has been authorized for detection and/or diagnosis of SARS-CoV-2 by FDA under an Emergency Use Authorization (EUA). This EUA will remain in effect (meaning this test can be used) for the duration of the COVID-19 declaration under Section 564(b)(1) of the Act, 21 U.S.C. section 360bbb-3(b)(1), unless the authorization is terminated or revoked.  Performed at [facility], [address] [place], [place] [address]      Labs: BNP (last 3 results) No results for input(s): BNP in the last 8760 hours. Basic Metabolic Panel: Recent Labs  Lab 05/26/20 1318 05/27/20 0300 05/28/20 0025  NA 134* 138 134*  K 3.3* 3.4* 3.4*  CL 102 106 99  CO2 21* 23 27  GLUCOSE 182* 103* 101*  BUN 20 9 8   CREATININE 0.93 0.71 0.62  CALCIUM 9.1 8.6* 8.6*  MG  --   --  1.8  PHOS  --   --  2.8   Liver Function  Tests: Recent Labs  Lab 05/26/20 1318  AST 25  ALT 18  ALKPHOS 82  BILITOT 0.8  PROT 6.7  ALBUMIN 3.6   Recent Labs  Lab 05/26/20 1318  LIPASE 36   No results for input(s): AMMONIA in the last 168 hours. CBC: Recent Labs  Lab 05/26/20 1317 05/27/20 0300 05/28/20 0025  WBC 12.4* 10.2 9.3  NEUTROABS  --   --  7.0  HGB 12.4 12.3 11.3*  HCT 35.8* 36.6 33.1*  MCV 92.7 95.1 92.7  PLT 268 223 227   Cardiac Enzymes: No results for input(s): CKTOTAL, CKMB, CKMBINDEX, TROPONINI in the last 168 hours. BNP: Invalid input(s): POCBNP CBG: No results for input(s): GLUCAP in the last 168 hours. D-Dimer No results for input(s): DDIMER in the last 72 hours. Hgb A1c No results for input(s): HGBA1C in the last 72 hours. Lipid Profile No results for input(s): CHOL, HDL, LDLCALC, TRIG, CHOLHDL, LDLDIRECT in the last 72 hours. Thyroid function studies No results for input(s): TSH, T4TOTAL, T3FREE, THYROIDAB in the last 72 hours.  Invalid input(s): FREET3 Anemia work up No results for input(s): VITAMINB12, FOLATE, FERRITIN, TIBC, IRON, RETICCTPCT in the last 72 hours. Urinalysis    Component Value Date/Time   COLORURINE YELLOW 05/26/2020 1318   APPEARANCEUR HAZY (A) 05/26/2020 1318   LABSPEC 1.008 05/26/2020 1318   PHURINE 6.0 05/26/2020 1318   GLUCOSEU NEGATIVE 05/26/2020 1318   HGBUR MODERATE (A) 05/26/2020 1318   BILIRUBINUR NEGATIVE 05/26/2020 1318   KETONESUR 5 (A) 05/26/2020 1318   PROTEINUR NEGATIVE 05/26/2020 1318   NITRITE POSITIVE (A) 05/26/2020 1318   LEUKOCYTESUR LARGE (A) 05/26/2020 1318   Sepsis Labs Invalid input(s): PROCALCITONIN,  WBC,  LACTICIDVEN Microbiology Recent Results (from the past 240 hour(s))  Urine culture     Status: Abnormal (Preliminary result)   Collection Time: 05/26/20  3:13 PM   Specimen: In/Out Cath Urine  Result Value Ref Range Status   Specimen Description IN/OUT CATH URINE  Final   Special Requests NONE  Final   Culture (A)   Final    >=100,000 COLONIES/mL ESCHERICHIA COLI SUSCEPTIBILITIES TO FOLLOW Performed at [facility], [address], [place], [place]    Report Status PENDING  Incomplete  Blood Culture (routine x 2)     Status: None (Preliminary result)   Collection Time: 05/26/20  3:35 PM  Specimen: BLOOD  Result Value Ref Range Status   Specimen Description BLOOD SITE NOT SPECIFIED  Final   Special Requests   Final    BOTTLES DRAWN AEROBIC AND ANAEROBIC Blood Culture results may not be optimal due to an excessive volume of blood received in culture bottles   Culture   Final    NO GROWTH 2 DAYS Performed at [facility], [address], [place], [place] [address]    Report Status PENDING  Incomplete  Blood Culture (routine x 2)     Status: None (Preliminary result)   Collection Time: 05/26/20  3:40 PM   Specimen: BLOOD  Result Value Ref Range Status   Specimen Description BLOOD SITE NOT SPECIFIED  Final   Special Requests   Final    BOTTLES DRAWN AEROBIC ONLY Blood Culture adequate volume   Culture   Final    NO GROWTH 2 DAYS Performed at [facility], [address], [place], [place] [address]    Report Status PENDING  Incomplete  Resp Panel by RT-PCR (Flu A&B, Covid) Nasopharyngeal Swab     Status: None   Collection Time: 05/26/20  4:00 PM   Specimen: Nasopharyngeal Swab; Nasopharyngeal(NP) swabs in vial transport medium  Result Value Ref Range Status   SARS Coronavirus 2 by RT PCR NEGATIVE NEGATIVE Final    Comment: (NOTE) SARS-CoV-2 target nucleic acids are NOT DETECTED.  The SARS-CoV-2 RNA is generally detectable in upper respiratory specimens during the acute phase of infection. The lowest concentration of SARS-CoV-2 viral copies this assay can detect is 138 copies/mL. A negative result does not preclude SARS-Cov-2 infection and should not be used as the sole basis for treatment or other patient management decisions. A negative result may occur with   improper specimen collection/handling, submission of specimen other than nasopharyngeal swab, presence of viral mutation(s) within the areas targeted by this assay, and inadequate number of viral copies(<138 copies/mL). A negative result must be combined with clinical observations, patient history, and epidemiological information. The expected result is Negative.  Fact Sheet for Patients:  BloggerCourse.com  Fact Sheet for Healthcare Providers:  SeriousBroker.it  This test is no t yet approved or cleared by the [place] FDA and  has been authorized for detection and/or diagnosis of SARS-CoV-2 by FDA under an Emergency Use Authorization (EUA). This EUA will remain  in effect (meaning this test can be used) for the duration of the COVID-19 declaration under Section 564(b)(1) of the Act, 21 U.S.C.section 360bbb-3(b)(1), unless the authorization is terminated  or revoked sooner.       Influenza A by PCR NEGATIVE NEGATIVE Final   Influenza B by PCR NEGATIVE NEGATIVE Final    Comment: (NOTE) The Xpert Xpress SARS-CoV-2/FLU/RSV plus assay is intended as an aid in the diagnosis of influenza from Nasopharyngeal swab specimens and should not be used as a sole basis for treatment. Nasal washings and aspirates are unacceptable for Xpert Xpress SARS-CoV-2/FLU/RSV testing.  Fact Sheet for Patients: BloggerCourse.com  Fact Sheet for Healthcare Providers: SeriousBroker.it  This test is not yet approved or cleared by the [place] FDA and has been authorized for detection and/or diagnosis of SARS-CoV-2 by FDA under an Emergency Use Authorization (EUA). This EUA will remain in effect (meaning this test can be used) for the duration of the COVID-19 declaration under Section 564(b)(1) of the Act, 21 U.S.C. section 360bbb-3(b)(1), unless the authorization is terminated  or revoked.  Performed at [facility], [address] [place], [place] [address]      Time coordinating discharge: 35 minutes  SIGNED:   Dorcas Carrow, MD  [facility] 05/28/2020, 2:42 PM

## 2020-05-29 LAB — URINE CULTURE: Culture: >=100000 — AB

## 2020-05-31 LAB — CULTURE, BLOOD (ROUTINE X 2)
Culture: NO GROWTH
Culture: NO GROWTH
Special Requests: ADEQUATE

## 2020-06-07 ENCOUNTER — Other Ambulatory Visit: Payer: Self-pay | Admitting: Physician Assistant

## 2020-06-08 ENCOUNTER — Ambulatory Visit (INDEPENDENT_AMBULATORY_CARE_PROVIDER_SITE_OTHER): Payer: BC Managed Care – PPO | Admitting: Physician Assistant

## 2020-06-08 ENCOUNTER — Other Ambulatory Visit: Payer: Self-pay

## 2020-06-08 ENCOUNTER — Encounter: Payer: Self-pay | Admitting: Physician Assistant

## 2020-06-08 VITALS — BP 122/78 | HR 92 | Temp 97.2°F | Ht 61.0 in | Wt 124.2 lb

## 2020-06-08 DIAGNOSIS — N12 Tubulo-interstitial nephritis, not specified as acute or chronic: Secondary | ICD-10-CM

## 2020-06-08 LAB — POCT URINALYSIS DIP (CLINITEK)
Bilirubin, UA: NEGATIVE
Glucose, UA: NEGATIVE mg/dL
Ketones, POC UA: NEGATIVE mg/dL
Leukocytes, UA: NEGATIVE
Nitrite, UA: NEGATIVE
Spec Grav, UA: >=1.03 — AB (ref 1.010–1.025)
Urobilinogen, UA: 0.2 E.U./dL
pH, UA: 5.5 (ref 5.0–8.0)

## 2020-06-08 MED ORDER — CEFTRIAXONE SODIUM 1 G IJ SOLR
1.0000 g | Freq: Once | INTRAMUSCULAR | Status: AC
  Administered 2020-06-08: 1 g via INTRAMUSCULAR

## 2020-06-08 MED ORDER — CIPROFLOXACIN HCL 500 MG PO TABS: 500.0000 mg | ORAL_TABLET | Freq: Two times a day (BID) | ORAL | 0 refills | Status: DC

## 2020-06-08 NOTE — Progress Notes (Signed)
Subjective:  Patient ID: Kimberly David, female    DOB: 1960/10/29  Age: 60 y.o. MRN: [id number]  Chief Complaint  Patient presents with  . Hospitalization Follow-up    Kidney infection    HPI  pt was admitted to the hospital on 05/26/20 for pyelonephritis -- she was given Rocephin while inpatient and sent home on keflex - blood cultures were done which were negative Urine culture was positive for Ecoli A CT was done which was negative - including no stones She continues to have mild back pain and some dysuria States since Saturday she is 'feeling bad again' Current Outpatient Medications on File Prior to Visit  Medication Sig Dispense Refill  . albuterol (VENTOLIN HFA) 108 (90 Base) MCG/ACT inhaler Inhale 1-2 puffs into the lungs every 4 (four) hours as needed for shortness of breath.    . ALPRAZolam (XANAX) 0.25 MG tablet TAKE 1 TABLET BY MOUTH EVERY DAY AT BEDTIME AS NEEDED (Patient taking differently: Take 0.25 mg by mouth at bedtime as needed for anxiety or sleep.) 30 tablet 0  . aspirin EC 81 MG tablet Take 1 tablet (81 mg total) by mouth daily. Swallow whole. 90 tablet 3  . atorvastatin (LIPITOR) 20 MG tablet TAKE 1 TABLET BY MOUTH EVERY DAY (Patient taking differently: Take 20 mg by mouth daily.) 90 tablet 0  . FYAVOLV 1-5 MG-MCG TABS tablet Take 1 tablet by mouth daily.    . meclizine (ANTIVERT) 25 MG tablet TAKE 1 TABLET EVERY 6 HOURS AS NEEDED FOR DIZZINESS (Patient taking differently: Take 25 mg by mouth daily.) 30 tablet 0  . metoprolol succinate (TOPROL-XL) 50 MG 24 hr tablet TAKE 1 TABLET BY MOUTH EVERYDAY AT BEDTIME 30 tablet 0  . nitroGLYCERIN (NITROSTAT) 0.4 MG SL tablet Place 0.4 mg under the tongue every 5 (five) minutes as needed for chest pain.    Marland Kitchen tolterodine (DETROL LA) 2 MG 24 hr capsule Take 2 mg by mouth daily.    Marland Kitchen venlafaxine XR (EFFEXOR-XR) 150 MG 24 hr capsule TAKE 1 CAPSULE BY MOUTH DAILY WITH BREAKFAST. (Patient taking differently: Take 150 mg by  mouth daily with breakfast.) 90 capsule 1   No current facility-administered medications on file prior to visit.   Past Medical History:  Diagnosis Date  . Abnormal blood chemistry 10/27/2019  . Angina pectoris (HCC) 01/30/2020  . Anxiety   . Cardiac murmur 05/09/2019  . COVID-19   . Depression   . Depression, major, single episode, moderate (HCC) 05/06/2019  . Easy bruising 10/27/2019  . Eustachian tube disorder 12/23/2019  . Ganglion cyst of wrist    Rt  . History of severe acute respiratory syndrome coronavirus 2 (SARS-CoV-2) disease 03/02/2019  . Hyperlipidemia    takes lipitor  . Mixed hyperlipidemia 05/06/2019  . Moderate recurrent major depression (HCC) 06/16/2019  . Need for tetanus, diphtheria, and acellular pertussis (Tdap) vaccine 03/17/2020  . Other fatigue 07/16/2019  . Otitis media 12/23/2019  . Palpitation 05/06/2019  . Palpitations 05/09/2019   Past Surgical History:  Procedure Laterality Date  . CESAREAN SECTION  02/08/2001  . GANGLION CYST EXCISION  01/11/2012   Procedure: REMOVAL GANGLION OF WRIST;  Surgeon: Tami Ribas, MD;  Location: [facility];  Service: Orthopedics;  Laterality: Right;  right wrist excision mass     Family History  Problem Relation Age of Onset  . Hyperlipidemia Mother   . Heart attack Mother   . Migraines Mother   . Lung cancer Father   .  Diabetes type II Sister   . Migraines Sister   . Diabetes type I Daughter    Social History   Socioeconomic History  . Marital status: Legally Separated    Spouse name: Not on file  . Number of children: 3  . Years of education: Not on file  . Highest education level: Not on file  Occupational History  . Not on file  Tobacco Use  . Smoking status: Never Smoker  . Smokeless tobacco: Never Used  Vaping Use  . Vaping Use: Never used  Substance and Sexual Activity  . Alcohol use: No  . Drug use: No  . Sexual activity: Yes    Birth control/protection: Post-menopausal  Other  Topics Concern  . Not on file  Social History Narrative  . Not on file   Social Determinants of Health   Financial Resource Strain: Not on file  Food Insecurity: Not on file  Transportation Needs: Not on file  Physical Activity: Not on file  Stress: Not on file  Social Connections: Not on file    Review of Systems CONSTITUTIONAL: see HPI E/N/T: Negative for ear pain, nasal congestion and sore throat.  CARDIOVASCULAR: Negative for chest pain, dizziness, palpitations and pedal edema.  RESPIRATORY: Negative for recent cough and dyspnea.  GASTROINTESTINAL: Negative for abdominal pain, acid reflux symptoms, constipation, diarrhea, nausea and vomiting.  GU- see HPI    Objective:  BP 122/78 (BP Location: Left Arm, Patient Position: Sitting, Cuff Size: Normal)   Pulse 92   Temp (!) 97.2 F (36.2 C) (Temporal)   Ht 5\' 1"  (1.549 m)   Wt 124 lb 3.2 oz (56.3 kg)   SpO2 98%   BMI 23.47 kg/m   BP/Weight 06/08/2020 05/28/2020 05/26/2020  Systolic BP 122 118 -  Diastolic BP 78 73 -  Wt. (Lbs) 124.2 - 125.44  BMI 23.47 - 23.7    Physical Exam PHYSICAL EXAM:   VS: BP 122/78 (BP Location: Left Arm, Patient Position: Sitting, Cuff Size: Normal)   Pulse 92   Temp (!) 97.2 F (36.2 C) (Temporal)   Ht 5\' 1"  (1.549 m)   Wt 124 lb 3.2 oz (56.3 kg)   SpO2 98%   BMI 23.47 kg/m   GEN: Well nourished, well developed, in no acute distress  Cardiac: RRR; no murmurs, rubs, or gallops,no edema -  Respiratory:  normal respiratory rate and pattern with no distress - normal breath sounds with no rales, rhonchi, wheezes or rubs  Right side CVA tenderness  Office Visit on 06/08/2020  Component Date Value Ref Range Status  . Color, UA 06/08/2020 yellow  yellow Final  . Clarity, UA 06/08/2020 clear  clear Final  . Glucose, UA 06/08/2020 negative  negative mg/dL Final  . Bilirubin, UA 06/08/2020 negative  negative Final  . Ketones, POC UA 06/08/2020 negative  negative mg/dL Final  . Spec  Grav, UA 06/08/2020 >=1.030* 1.010 - 1.025 Final  . Blood, UA 06/08/2020 moderate* negative Final  . pH, UA 06/08/2020 5.5  5.0 - 8.0 Final  . POC PROTEIN,UA 06/08/2020 trace  negative, trace Final  . Urobilinogen, UA 06/08/2020 0.2  0.2 or 1.0 E.U./dL Final  . Nitrite, UA 06/10/2020 Negative  Negative Final  . Leukocytes, UA 06/08/2020 Negative  Negative Final     Diabetic Foot Exam - Simple   No data filed      Lab Results  Component Value Date   WBC 9.3 05/28/2020   HGB 11.3 (L) 05/28/2020   HCT 33.1 (  L) 05/28/2020   PLT 227 05/28/2020   GLUCOSE 101 (H) 05/28/2020   CHOL 169 03/17/2020   TRIG 131 03/17/2020   HDL 49 03/17/2020   LDLCALC 97 03/17/2020   ALT 18 05/26/2020   AST 25 05/26/2020   NA 134 (L) 05/28/2020   K 3.4 (L) 05/28/2020   CL 99 05/28/2020   CREATININE 0.62 05/28/2020   BUN 8 05/28/2020   CO2 27 05/28/2020   TSH 2.130 03/17/2020   INR 1.1 05/26/2020      Assessment & Plan:   1. Pyelonephritis - Urine Culture - POCT URINALYSIS DIP (CLINITEK) - cefTRIAXone (ROCEPHIN) injection 1 g - CBC with Differential/Platelet - Comprehensive metabolic panel - ciprofloxacin (CIPRO) 500 MG tablet; Take 1 tablet (500 mg total) by mouth 2 (two) times daily.  Dispense: 20 tablet; Refill: 0    Meds ordered this encounter  Medications  . cefTRIAXone (ROCEPHIN) injection 1 g  . ciprofloxacin (CIPRO) 500 MG tablet    Sig: Take 1 tablet (500 mg total) by mouth 2 (two) times daily.    Dispense:  20 tablet    Refill:  0    Order Specific Question:   Supervising Provider    AnswerCorey Harold    Orders Placed This Encounter  Procedures  . Urine Culture  . CBC with Differential/Platelet  . Comprehensive metabolic panel  . POCT URINALYSIS DIP (CLINITEK)      Follow-up: Return if symptoms worsen or fail to improve.  An After Visit Summary was printed and given to the patient.  Jettie Pagan [facility] [phone]

## 2020-06-09 LAB — COMPREHENSIVE METABOLIC PANEL
ALT: 23 IU/L (ref 0–32)
AST: 18 IU/L (ref 0–40)
Albumin/Globulin Ratio: 1.8 (ref 1.2–2.2)
Albumin: 4.2 g/dL (ref 3.8–4.9)
Alkaline Phosphatase: 104 IU/L (ref 44–121)
BUN/Creatinine Ratio: 35 — ABNORMAL HIGH (ref 12–28)
BUN: 24 mg/dL (ref 8–27)
Bilirubin Total: <0.2 mg/dL (ref 0.0–1.2)
CO2: 19 mmol/L — ABNORMAL LOW (ref 20–29)
Calcium: 8.9 mg/dL (ref 8.7–10.3)
Chloride: 104 mmol/L (ref 96–106)
Creatinine, Ser: 0.69 mg/dL (ref 0.57–1.00)
Globulin, Total: 2.4 g/dL (ref 1.5–4.5)
Glucose: 148 mg/dL — ABNORMAL HIGH (ref 65–99)
Potassium: 3.9 mmol/L (ref 3.5–5.2)
Sodium: 140 mmol/L (ref 134–144)
Total Protein: 6.6 g/dL (ref 6.0–8.5)
eGFR: 99 mL/min/{1.73_m2} (ref >59)

## 2020-06-09 LAB — CBC WITH DIFFERENTIAL/PLATELET
Basophils Absolute: 0.1 10*3/uL (ref 0.0–0.2)
Basos: 1 %
EOS (ABSOLUTE): 0.3 10*3/uL (ref 0.0–0.4)
Eos: 3 %
Hematocrit: 36.7 % (ref 34.0–46.6)
Hemoglobin: 12.6 g/dL (ref 11.1–15.9)
Immature Grans (Abs): 0 10*3/uL (ref 0.0–0.1)
Immature Granulocytes: 0 %
Lymphocytes Absolute: 2.2 10*3/uL (ref 0.7–3.1)
Lymphs: 24 %
MCH: 32 pg (ref 26.6–33.0)
MCHC: 34.3 g/dL (ref 31.5–35.7)
MCV: 93 fL (ref 79–97)
Monocytes Absolute: 0.6 10*3/uL (ref 0.1–0.9)
Monocytes: 7 %
Neutrophils Absolute: 5.9 10*3/uL (ref 1.4–7.0)
Neutrophils: 65 %
Platelets: 382 10*3/uL (ref 150–450)
RBC: 3.94 x10E6/uL (ref 3.77–5.28)
RDW: 12.7 % (ref 11.7–15.4)
WBC: 9.1 10*3/uL (ref 3.4–10.8)

## 2020-06-11 LAB — URINE CULTURE

## 2020-06-21 ENCOUNTER — Other Ambulatory Visit: Payer: Self-pay | Admitting: Family Medicine

## 2020-06-22 ENCOUNTER — Ambulatory Visit: Payer: BC Managed Care – PPO

## 2020-06-22 ENCOUNTER — Other Ambulatory Visit: Payer: Self-pay

## 2020-07-01 ENCOUNTER — Other Ambulatory Visit: Payer: Self-pay | Admitting: Physician Assistant

## 2020-07-04 ENCOUNTER — Other Ambulatory Visit: Payer: Self-pay | Admitting: Physician Assistant

## 2020-07-19 ENCOUNTER — Encounter: Payer: Self-pay | Admitting: Physician Assistant

## 2020-07-19 ENCOUNTER — Ambulatory Visit (INDEPENDENT_AMBULATORY_CARE_PROVIDER_SITE_OTHER): Payer: BC Managed Care – PPO | Admitting: Physician Assistant

## 2020-07-19 ENCOUNTER — Other Ambulatory Visit: Payer: Self-pay

## 2020-07-19 VITALS — BP 132/84 | HR 99 | Temp 96.8°F | Ht 61.0 in | Wt 120.4 lb

## 2020-07-19 DIAGNOSIS — R112 Nausea with vomiting, unspecified: Secondary | ICD-10-CM | POA: Diagnosis not present

## 2020-07-19 DIAGNOSIS — R3129 Other microscopic hematuria: Secondary | ICD-10-CM | POA: Diagnosis not present

## 2020-07-19 DIAGNOSIS — M549 Dorsalgia, unspecified: Secondary | ICD-10-CM | POA: Diagnosis not present

## 2020-07-19 LAB — CBC WITH DIFFERENTIAL/PLATELET
Basophils Absolute: 0 10*3/uL (ref 0.0–0.2)
Basos: 1 %
EOS (ABSOLUTE): 0.1 10*3/uL (ref 0.0–0.4)
Eos: 1 %
Hematocrit: 39.5 % (ref 34.0–46.6)
Hemoglobin: 13.3 g/dL (ref 11.1–15.9)
Lymphocytes Absolute: 1.2 10*3/uL (ref 0.7–3.1)
Lymphs: 15 %
MCH: 31.2 pg (ref 26.6–33.0)
MCHC: 33.7 g/dL (ref 31.5–35.7)
MCV: 93 fL (ref 79–97)
Monocytes Absolute: 0.5 10*3/uL (ref 0.1–0.9)
Monocytes: 6 %
Neutrophils Absolute: 6.2 10*3/uL (ref 1.4–7.0)
Neutrophils: 77 %
Platelets: 304 10*3/uL (ref 150–450)
RBC: 4.26 x10E6/uL (ref 3.77–5.28)
RDW: 13.7 % (ref 11.7–15.4)
WBC: 7.9 10*3/uL (ref 3.4–10.8)

## 2020-07-19 LAB — COMPREHENSIVE METABOLIC PANEL
ALT: 12 IU/L (ref 0–32)
AST: 23 IU/L (ref 0–40)
Albumin/Globulin Ratio: 2.4 — ABNORMAL HIGH (ref 1.2–2.2)
Albumin: 4.5 g/dL (ref 3.8–4.9)
Alkaline Phosphatase: 119 IU/L (ref 44–121)
BUN/Creatinine Ratio: 24 (ref 12–28)
BUN: 15 mg/dL (ref 8–27)
Bilirubin Total: 0.4 mg/dL (ref 0.0–1.2)
CO2: 25 mmol/L (ref 20–29)
Calcium: 9.6 mg/dL (ref 8.7–10.3)
Chloride: 103 mmol/L (ref 96–106)
Creatinine, Ser: 0.62 mg/dL (ref 0.57–1.00)
Globulin, Total: 1.9 g/dL (ref 1.5–4.5)
Glucose: 104 mg/dL — ABNORMAL HIGH (ref 65–99)
Potassium: 3.4 mmol/L — ABNORMAL LOW (ref 3.5–5.2)
Sodium: 140 mmol/L (ref 134–144)
Total Protein: 6.4 g/dL (ref 6.0–8.5)
eGFR: 102 mL/min/{1.73_m2} (ref >59)

## 2020-07-19 LAB — POCT URINALYSIS DIP (CLINITEK)
Bilirubin, UA: NEGATIVE
Glucose, UA: NEGATIVE mg/dL
Ketones, POC UA: NEGATIVE mg/dL
Leukocytes, UA: NEGATIVE
Nitrite, UA: NEGATIVE
Spec Grav, UA: 1.02 (ref 1.010–1.025)
Urobilinogen, UA: 0.2 E.U./dL
pH, UA: 6 (ref 5.0–8.0)

## 2020-07-19 LAB — POC COVID19 BINAXNOW: SARS Coronavirus 2 Ag: NEGATIVE

## 2020-07-19 MED ORDER — ONDANSETRON HCL 4 MG PO TABS: 4.0000 mg | ORAL_TABLET | Freq: Three times a day (TID) | ORAL | 0 refills | Status: DC | PRN

## 2020-07-19 NOTE — Progress Notes (Signed)
Acute Office Visit  Subjective:    Patient ID: Kimberly David, female    DOB: 07/10/1960, 60 y.o.   MRN: [id number]  Chief Complaint  Patient presents with  .   . Nausea and vomiting    HPI Patient is in today for nausea and vomiting She states on Saturday she vomited three times, Sunday a few times and once today - has had associated nausea with these symptoms She denies fever, cough, cold, abdominal pain, urine symptoms or rash She states she had a headache that started last night that has been intermittent Says she has had decreased appetite as well  Past Medical History:  Diagnosis Date  . Abnormal blood chemistry 10/27/2019  . Angina pectoris ([place]) 01/30/2020  . Anxiety   . Cardiac murmur 05/09/2019  . COVID-19   . Depression   . Depression, major, single episode, moderate ([place]) 05/06/2019  . Easy bruising 10/27/2019  . Eustachian tube disorder 12/23/2019  . Ganglion cyst of wrist    Rt  . History of severe acute respiratory syndrome coronavirus 2 (SARS-CoV-2) disease 03/02/2019  . Hyperlipidemia    takes lipitor  . Mixed hyperlipidemia 05/06/2019  . Moderate recurrent major depression ([place]) 06/16/2019  . Need for tetanus, diphtheria, and acellular pertussis (Tdap) vaccine 03/17/2020  . Other fatigue 07/16/2019  . Otitis media 12/23/2019  . Palpitation 05/06/2019  . Palpitations 05/09/2019    Past Surgical History:  Procedure Laterality Date  . CESAREAN SECTION  02/08/2001  . GANGLION CYST EXCISION  01/11/2012   Procedure: REMOVAL GANGLION OF WRIST;  Surgeon: Tennis Must, MD;  Location: [place];  Service: Orthopedics;  Laterality: Right;  right wrist excision mass     Family History  Problem Relation Age of Onset  . Hyperlipidemia Mother   . Heart attack Mother   . Migraines Mother   . Lung cancer Father   . Diabetes type II Sister   . Migraines Sister   . Diabetes type I Daughter     Social History   Socioeconomic History  .  Marital status: Legally Separated    Spouse name: Not on file  . Number of children: 3  . Years of education: Not on file  . Highest education level: Not on file  Occupational History  . Not on file  Tobacco Use  . Smoking status: Never Smoker  . Smokeless tobacco: Never Used  Vaping Use  . Vaping Use: Never used  Substance and Sexual Activity  . Alcohol use: No  . Drug use: No  . Sexual activity: Yes    Birth control/protection: Post-menopausal  Other Topics Concern  . Not on file  Social History Narrative  . Not on file   Social Determinants of Health   Financial Resource Strain: Not on file  Food Insecurity: Not on file  Transportation Needs: Not on file  Physical Activity: Not on file  Stress: Not on file  Social Connections: Not on file  Intimate Partner Violence: Not on file    Outpatient Medications Prior to Visit  Medication Sig Dispense Refill  . albuterol (VENTOLIN HFA) 108 (90 Base) MCG/ACT inhaler Inhale 1-2 puffs into the lungs every 4 (four) hours as needed for shortness of breath.    . ALPRAZolam (XANAX) 0.25 MG tablet TAKE 1 TABLET BY MOUTH EVERY DAY AT BEDTIME AS NEEDED (Patient taking differently: Take 0.25 mg by mouth at bedtime as needed for anxiety or sleep.) 30 tablet 0  . aspirin EC 81  MG tablet Take 1 tablet (81 mg total) by mouth daily. Swallow whole. 90 tablet 3  . atorvastatin (LIPITOR) 20 MG tablet TAKE 1 TABLET BY MOUTH EVERY DAY 90 tablet 0  . ciprofloxacin (CIPRO) 500 MG tablet Take 1 tablet (500 mg total) by mouth 2 (two) times daily. 20 tablet 0  . FYAVOLV 1-5 MG-MCG TABS tablet Take 1 tablet by mouth daily.    . meclizine (ANTIVERT) 25 MG tablet TAKE 1 TABLET EVERY 6 HOURS AS NEEDED FOR DIZZINESS (Patient taking differently: Take 25 mg by mouth daily.) 30 tablet 0  . meloxicam (MOBIC) 15 MG tablet TAKE 1 TABLET BY MOUTH EVERY DAY 30 tablet 0  . metoprolol succinate (TOPROL-XL) 50 MG 24 hr tablet TAKE 1 TABLET BY MOUTH EVERYDAY AT BEDTIME 30  tablet 0  . nitroGLYCERIN (NITROSTAT) 0.4 MG SL tablet Place 0.4 mg under the tongue every 5 (five) minutes as needed for chest pain.    Marland Kitchen tolterodine (DETROL LA) 2 MG 24 hr capsule Take 2 mg by mouth daily.    Marland Kitchen venlafaxine XR (EFFEXOR-XR) 150 MG 24 hr capsule TAKE 1 CAPSULE BY MOUTH DAILY WITH BREAKFAST. (Patient taking differently: Take 150 mg by mouth daily with breakfast.) 90 capsule 1   No facility-administered medications prior to visit.    Allergies  Allergen Reactions  . Codeine Nausea And Vomiting    Review of Systems CONSTITUTIONAL: see HPI E/N/T: Negative for ear pain, nasal congestion and sore throat.  CARDIOVASCULAR: Negative for chest pain, dizziness, palpitations and pedal edema.  RESPIRATORY: Negative for recent cough and dyspnea.  GASTROINTESTINAL: see HPI  MSK: Negative for arthralgias and myalgias.  INTEGUMENTARY: Negative for rash.  PSYCHIATRIC: Negative for sleep disturbance and to question depression screen.  Negative for depression, negative for anhedonia.         Objective:    Physical Exam PHYSICAL EXAM:   VS: BP 132/84 (BP Location: Left Arm, Patient Position: Supine, Cuff Size: Normal)   Pulse 99   Temp (!) 96.8 F (36 C) (Temporal)   Ht 5\' 1"  (1.549 m)   Wt 120 lb 6.4 oz (54.6 kg)   SpO2 98%   BMI 22.75 kg/m   GEN: Well nourished, well developed, pt states feels lethargic HEENT: normal external ears and nose - normal external auditory canals and TMS - hearing grossly normal -- Lips, Teeth and Gums - normal  Oropharynx - normal mucosa, palate, and posterior pharynx Cardiac: RRR; no murmurs, rubs, or gallops,no edema -  Respiratory:  normal respiratory rate and pattern with no distress - normal breath sounds with no rales, rhonchi, wheezes or rubs GI: normal bowel sounds, no masses - has mild generalized tenderness - no focal areas of pain or guarding or rebound MS: no deformity or atrophy  Skin: warm and dry, no rash  Psych: euthymic mood,  appropriate affect and demeanor  Office Visit on 07/19/2020  Component Date Value Ref Range Status  . Color, UA 07/19/2020 yellow  yellow Final  . Clarity, UA 07/19/2020 clear  clear Final  . Glucose, UA 07/19/2020 negative  negative mg/dL Final  . Bilirubin, UA 07/19/2020 negative  negative Final  . Ketones, POC UA 07/19/2020 negative  negative mg/dL Final  . Spec Grav, UA 07/19/2020 1.020  1.010 - 1.025 Final  . Blood, UA 07/19/2020 small* negative Final  . pH, UA 07/19/2020 6.0  5.0 - 8.0 Final  . POC PROTEIN,UA 07/19/2020 trace  negative, trace Final  . Urobilinogen, UA 07/19/2020 0.2  0.2 or 1.0 E.U./dL Final  . Nitrite, UA 07/19/2020 Negative  Negative Final  . Leukocytes, UA 07/19/2020 Negative  Negative Final  . SARS Coronavirus 2 Ag 07/19/2020 Negative  Negative Final     BP 132/84 (BP Location: Left Arm, Patient Position: Supine, Cuff Size: Normal)   Pulse 99   Temp (!) 96.8 F (36 C) (Temporal)   Ht $R'5\' 1"'Ml$  (1.549 m)   Wt 120 lb 6.4 oz (54.6 kg)   SpO2 98%   BMI 22.75 kg/m  Wt Readings from Last 3 Encounters:  07/19/20 120 lb 6.4 oz (54.6 kg)  06/08/20 124 lb 3.2 oz (56.3 kg)  05/26/20 125 lb 7.1 oz (56.9 kg)    Health Maintenance Due  Topic Date Due  . Hepatitis C Screening  Never done  . COLONOSCOPY (Pts 45-60yrs Insurance coverage will need to be confirmed)  Never done  . PAP SMEAR-Modifier  03/10/2019    There are no preventive care reminders to display for this patient.   Lab Results  Component Value Date   TSH 2.130 03/17/2020   Lab Results  Component Value Date   WBC 9.1 06/08/2020   HGB 12.6 06/08/2020   HCT 36.7 06/08/2020   MCV 93 06/08/2020   PLT 382 06/08/2020   Lab Results  Component Value Date   NA 140 06/08/2020   K 3.9 06/08/2020   CO2 19 (L) 06/08/2020   GLUCOSE 148 (H) 06/08/2020   BUN 24 06/08/2020   CREATININE 0.69 06/08/2020   BILITOT <0.2 06/08/2020   ALKPHOS 104 06/08/2020   AST 18 06/08/2020   ALT 23 06/08/2020    PROT 6.6 06/08/2020   ALBUMIN 4.2 06/08/2020   CALCIUM 8.9 06/08/2020   ANIONGAP 8 05/28/2020   EGFR 99 06/08/2020   Lab Results  Component Value Date   CHOL 169 03/17/2020   Lab Results  Component Value Date   HDL 49 03/17/2020   Lab Results  Component Value Date   LDLCALC 97 03/17/2020   Lab Results  Component Value Date   TRIG 131 03/17/2020   Lab Results  Component Value Date   CHOLHDL 3.4 03/17/2020   No results found for: HGBA1C     Assessment & Plan:  1. Nausea and vomiting, intractability of vomiting not specified, unspecified vomiting type - POC COVID-19 BinaxNow - CBC with Differential/Platelet - Comprehensive metabolic panel - ondansetron (ZOFRAN) 4 MG tablet; Take 1 tablet (4 mg total) by mouth every 8 (eight) hours as needed for nausea or vomiting.  Dispense: 20 tablet; Refill: 0 RECOMMEND IF SHE IS NOT ABLE TO KEEP ANYTHING DOWN IN NEXT SEVERAL HOURS WOULD GO TO ED FOR HYDRATION - PT UNDERSTANDS AND AGREEABLE 2. Other microscopic hematuria - Urine Culture Pt had recent history of pyelonephritis - CT showed no stones    Meds ordered this encounter  Medications  . ondansetron (ZOFRAN) 4 MG tablet    Sig: Take 1 tablet (4 mg total) by mouth every 8 (eight) hours as needed for nausea or vomiting.    Dispense:  20 tablet    Refill:  0    Order Specific Question:   Supervising Provider    AnswerShelton Silvas    Orders Placed This Encounter  Procedures  . Urine Culture  . CBC with Differential/Platelet  . Comprehensive metabolic panel  . POCT URINALYSIS DIP (CLINITEK)  . POC COVID-19 BinaxNow     Follow-up: No follow-ups on file.  An After Visit Summary was  printed and given to the patient.  Yetta Flock [facility] [phone]

## 2020-07-26 ENCOUNTER — Telehealth: Payer: Self-pay

## 2020-07-26 DIAGNOSIS — Z006 Encounter for examination for normal comparison and control in clinical research program: Secondary | ICD-10-CM

## 2020-07-26 NOTE — Telephone Encounter (Signed)
I have attempted without success to contact this patient by phone for her Identify 90 day follow up phone call. I left a message for patient to return my phone call with my name and callback number. An e-mail was also sent to patient.  

## 2020-07-27 ENCOUNTER — Other Ambulatory Visit: Payer: Self-pay | Admitting: Physician Assistant

## 2020-08-01 ENCOUNTER — Other Ambulatory Visit: Payer: Self-pay | Admitting: Physician Assistant

## 2020-08-03 ENCOUNTER — Other Ambulatory Visit: Payer: Self-pay

## 2020-08-03 ENCOUNTER — Ambulatory Visit (INDEPENDENT_AMBULATORY_CARE_PROVIDER_SITE_OTHER): Payer: BC Managed Care – PPO | Admitting: Nurse Practitioner

## 2020-08-03 ENCOUNTER — Encounter: Payer: Self-pay | Admitting: Nurse Practitioner

## 2020-08-03 VITALS — BP 154/102 | HR 91 | Temp 98.2°F | Ht 61.0 in | Wt 120.0 lb

## 2020-08-03 DIAGNOSIS — R1011 Right upper quadrant pain: Secondary | ICD-10-CM | POA: Diagnosis not present

## 2020-08-03 DIAGNOSIS — R112 Nausea with vomiting, unspecified: Secondary | ICD-10-CM | POA: Diagnosis not present

## 2020-08-03 DIAGNOSIS — R030 Elevated blood-pressure reading, without diagnosis of hypertension: Secondary | ICD-10-CM

## 2020-08-03 DIAGNOSIS — K219 Gastro-esophageal reflux disease without esophagitis: Secondary | ICD-10-CM | POA: Diagnosis not present

## 2020-08-03 DIAGNOSIS — R3129 Other microscopic hematuria: Secondary | ICD-10-CM | POA: Diagnosis not present

## 2020-08-03 DIAGNOSIS — R1013 Epigastric pain: Secondary | ICD-10-CM | POA: Diagnosis not present

## 2020-08-03 LAB — POCT URINALYSIS DIPSTICK
Bilirubin, UA: NEGATIVE
Glucose, UA: NEGATIVE
Ketones, UA: NEGATIVE
Leukocytes, UA: NEGATIVE
Nitrite, UA: NEGATIVE
Protein, UA: POSITIVE — AB
Spec Grav, UA: 1.015 (ref 1.010–1.025)
Urobilinogen, UA: 1 E.U./dL
pH, UA: 8 (ref 5.0–8.0)

## 2020-08-03 MED ORDER — ONDANSETRON 4 MG PO TBDP
4.0000 mg | ORAL_TABLET | Freq: Three times a day (TID) | ORAL | 0 refills | Status: DC | PRN
Start: 2020-08-03 — End: 2020-08-04

## 2020-08-03 MED ORDER — OMEPRAZOLE 20 MG PO CPDR: 20.0000 mg | DELAYED_RELEASE_CAPSULE | Freq: Every day | ORAL | 3 refills | Status: DC

## 2020-08-03 NOTE — Progress Notes (Signed)
Acute Office Visit  Subjective:    Patient ID: Kimberly David, female    DOB: 1960-05-07, 60 y.o.   MRN: [id number]  Chief Complaint  Patient presents with  . Emesis        HPI Patient is in today for chronic vomiting and decreased appetite. Onset of symptoms was 2-days ago. She was seen by PCP on 07/19/20 for same symptoms. Zofran prescribed but pt states she was unable to keep medication down due to persistent vomiting.   Past Medical History:  Diagnosis Date  . Abnormal blood chemistry 10/27/2019  . Angina pectoris ([place]) 01/30/2020  . Anxiety   . Cardiac murmur 05/09/2019  . COVID-19   . Depression   . Depression, major, single episode, moderate ([place]) 05/06/2019  . Easy bruising 10/27/2019  . Eustachian tube disorder 12/23/2019  . Ganglion cyst of wrist    Rt  . History of severe acute respiratory syndrome coronavirus 2 (SARS-CoV-2) disease 03/02/2019  . Hyperlipidemia    takes lipitor  . Mixed hyperlipidemia 05/06/2019  . Moderate recurrent major depression ([place]) 06/16/2019  . Need for tetanus, diphtheria, and acellular pertussis (Tdap) vaccine 03/17/2020  . Other fatigue 07/16/2019  . Otitis media 12/23/2019  . Palpitation 05/06/2019  . Palpitations 05/09/2019    Past Surgical History:  Procedure Laterality Date  . CESAREAN SECTION  02/08/2001  . GANGLION CYST EXCISION  01/11/2012   Procedure: REMOVAL GANGLION OF WRIST;  Surgeon: Tennis Must, MD;  Location: [place];  Service: Orthopedics;  Laterality: Right;  right wrist excision mass     Family History  Problem Relation Age of Onset  . Hyperlipidemia Mother   . Heart attack Mother   . Migraines Mother   . Lung cancer Father   . Diabetes type II Sister   . Migraines Sister   . Diabetes type I Daughter     Social History   Socioeconomic History  . Marital status: Legally Separated    Spouse name: Not on file  . Number of children: 3  . Years of education: Not on file  . Highest  education level: Not on file  Occupational History  . Not on file  Tobacco Use  . Smoking status: Never Smoker  . Smokeless tobacco: Never Used  Vaping Use  . Vaping Use: Never used  Substance and Sexual Activity  . Alcohol use: No  . Drug use: No  . Sexual activity: Yes    Birth control/protection: Post-menopausal  Other Topics Concern  . Not on file  Social History Narrative  . Not on file      Outpatient Medications Prior to Visit  Medication Sig Dispense Refill  . albuterol (VENTOLIN HFA) 108 (90 Base) MCG/ACT inhaler Inhale 1-2 puffs into the lungs every 4 (four) hours as needed for shortness of breath.    . ALPRAZolam (XANAX) 0.25 MG tablet TAKE 1 TABLET BY MOUTH EVERY DAY AT BEDTIME AS NEEDED (Patient taking differently: Take 0.25 mg by mouth at bedtime as needed for anxiety or sleep.) 30 tablet 0  . aspirin EC 81 MG tablet Take 1 tablet (81 mg total) by mouth daily. Swallow whole. 90 tablet 3  . atorvastatin (LIPITOR) 20 MG tablet TAKE 1 TABLET BY MOUTH EVERY DAY 90 tablet 0  . FYAVOLV 1-5 MG-MCG TABS tablet Take 1 tablet by mouth daily.    . meclizine (ANTIVERT) 25 MG tablet TAKE 1 TABLET EVERY 6 HOURS AS NEEDED FOR DIZZINESS (Patient taking differently: Take 25  mg by mouth daily.) 30 tablet 0  . meloxicam (MOBIC) 15 MG tablet TAKE 1 TABLET BY MOUTH EVERY DAY 30 tablet 2  . metoprolol succinate (TOPROL-XL) 50 MG 24 hr tablet TAKE 1 TABLET BY MOUTH EVERYDAY AT BEDTIME 30 tablet 0  . nitroGLYCERIN (NITROSTAT) 0.4 MG SL tablet Place 0.4 mg under the tongue every 5 (five) minutes as needed for chest pain.    Marland Kitchen ondansetron (ZOFRAN) 4 MG tablet Take 1 tablet (4 mg total) by mouth every 8 (eight) hours as needed for nausea or vomiting. 20 tablet 0  . tolterodine (DETROL LA) 2 MG 24 hr capsule Take 2 mg by mouth daily.    Marland Kitchen venlafaxine XR (EFFEXOR-XR) 150 MG 24 hr capsule TAKE 1 CAPSULE BY MOUTH DAILY WITH BREAKFAST. (Patient taking differently: Take 150 mg by mouth daily with  breakfast.) 90 capsule 1  . ciprofloxacin (CIPRO) 500 MG tablet Take 1 tablet (500 mg total) by mouth 2 (two) times daily. 20 tablet 0   No facility-administered medications prior to visit.    Allergies  Allergen Reactions  . Codeine Nausea And Vomiting    Review of Systems  Constitutional: Positive for appetite change and fatigue. Negative for unexpected weight change.  HENT: Negative for congestion, ear pain, rhinorrhea, sinus pressure, sinus pain and tinnitus.   Eyes: Negative for pain.  Respiratory: Negative for cough and shortness of breath.   Cardiovascular: Negative for chest pain, palpitations and leg swelling.  Gastrointestinal: Positive for nausea and vomiting. Negative for abdominal pain, constipation and diarrhea.  Endocrine: Negative for cold intolerance, heat intolerance, polydipsia, polyphagia and polyuria.  Genitourinary: Negative for dysuria, frequency and hematuria.  Musculoskeletal: Negative for arthralgias, back pain, joint swelling and myalgias.  Skin: Negative for rash.  Allergic/Immunologic: Negative for environmental allergies.  Neurological: Negative for dizziness and headaches.  Hematological: Negative for adenopathy.  Psychiatric/Behavioral: Negative for decreased concentration and sleep disturbance. The patient is not nervous/anxious.        Objective:    Physical Exam Vitals reviewed.  Constitutional:      Appearance: Normal appearance.  HENT:     Head: Normocephalic.     Right Ear: Tympanic membrane normal.     Left Ear: Tympanic membrane normal.     Nose: Nose normal.     Mouth/Throat:     Mouth: Mucous membranes are moist.  Eyes:     Pupils: Pupils are equal, round, and reactive to light.  Cardiovascular:     Rate and Rhythm: Normal rate and regular rhythm.     Pulses: Normal pulses.     Heart sounds: Normal heart sounds.  Pulmonary:     Effort: Pulmonary effort is normal.     Breath sounds: Normal breath sounds.  Abdominal:      General: Bowel sounds are normal.     Palpations: Abdomen is soft.  Musculoskeletal:        General: Normal range of motion.     Cervical back: Neck supple.  Skin:    General: Skin is warm and dry.     Capillary Refill: Capillary refill takes less than 2 seconds.  Neurological:     General: No focal deficit present.     Mental Status: She is alert and oriented to person, place, and time.  Psychiatric:        Mood and Affect: Mood normal.        Behavior: Behavior normal.     BP (!) 154/102 (BP Location: Left Arm, Patient Position:  Sitting)   Pulse 91   Temp 98.2 F (36.8 C) (Temporal)   Ht _0  (1.549 m)   Wt 120 lb (54.4 kg)   SpO2 97%   BMI 22.67 kg/m  Wt Readings from Last 3 Encounters:  08/03/20 120 lb (54.4 kg)  07/19/20 120 lb 6.4 oz (54.6 kg)  06/08/20 124 lb 3.2 oz (56.3 kg)    Health Maintenance Due  Topic Date Due  . Hepatitis C Screening  Never done  . COLONOSCOPY (Pts 45-60yr Insurance coverage will need to be confirmed)  Never done  . PAP SMEAR-Modifier  03/10/2019     Lab Results  Component Value Date   TSH 2.130 03/17/2020   Lab Results  Component Value Date   WBC 7.9 07/19/2020   HGB 13.3 07/19/2020   HCT 39.5 07/19/2020   MCV 93 07/19/2020   PLT 304 07/19/2020   Lab Results  Component Value Date   NA 140 07/19/2020   K 3.4 (L) 07/19/2020   CO2 25 07/19/2020   GLUCOSE 104 (H) 07/19/2020   BUN 15 07/19/2020   CREATININE 0.62 07/19/2020   BILITOT 0.4 07/19/2020   ALKPHOS 119 07/19/2020   AST 23 07/19/2020   ALT 12 07/19/2020   PROT 6.4 07/19/2020   ALBUMIN 4.5 07/19/2020   CALCIUM 9.6 07/19/2020   ANIONGAP 8 05/28/2020   EGFR 102 07/19/2020   Lab Results  Component Value Date   CHOL 169 03/17/2020   Lab Results  Component Value Date   HDL 49 03/17/2020   Lab Results  Component Value Date   LDLCALC 97 03/17/2020   Lab Results  Component Value Date   TRIG 131 03/17/2020   Lab Results  Component Value Date   CHOLHDL  3.4 03/17/2020       Assessment & Plan:   1. Nausea and vomiting, intractability of vomiting not specified, unspecified vomiting type - POCT urinalysis dipstick - ondansetron (ZOFRAN ODT) 4 MG disintegrating tablet; Take 1 tablet (4 mg total) by mouth every 8 (eight) hours as needed for nausea or vomiting.  Dispense: 20 tablet; Refill: 0 - [place]domen Limited RUQ (LIVER/GB); Future - Amylase - Lipase  2. Other microscopic hematuria - Ambulatory referral to Nephrology  3. Gastroesophageal reflux disease without esophagitis - omeprazole (PRILOSEC) 20 MG capsule; Take 1 capsule (20 mg total) by mouth daily.  Dispense: 30 capsule; Refill: 3  4. RUQ pain - [place]Abdomen Limited RUQ (LIVER/GB); Future - Amylase - Lipase     Problem List Items Addressed This Visit      Digestive   Nausea and vomiting - Primary   Relevant Orders   POCT urinalysis dipstick (Completed)    Take Zofran 4 mg ODT as needed every 8 hours for nausea Take Omeprazole 20 mg daily for acid reflux Follow Gallbladder diet We will call you with abdominal [place]appt. We will call you with appt with nephrologist for hematuria Follow-up 1-week   Follow-up:1-week   Signed, SRip Harbour NP

## 2020-08-03 NOTE — Patient Instructions (Addendum)
Take Zofran 4 mg ODT as needed every 8 hours for nausea Take Omeprazole 20 mg daily for acid reflux Follow Gallbladder diet We will call you with abdominal US appt. We will call you with appt with nephrologist for hematuria Follow-up 1-week  Hematuria, Adult Hematuria is blood in the urine. Blood may be visible in the urine, or it may be identified with a test. This condition can be caused by infections of the bladder, urethra, kidney, or prostate. Other possible causes include:  Kidney stones.  Cancer of the urinary tract.  Too much calcium in the urine.  Conditions that are passed from parent to child (inherited conditions).  Exercise that requires a lot of energy. Infections can usually be treated with medicine, and a kidney stone usually will pass through your urine. If neither of these is the cause of your hematuria, more tests may be needed to identify the cause of your symptoms. It is very important to tell your health care provider about any blood in your urine, even if it is painless or the blood stops without treatment. Blood in the urine, when it happens and then stops and then happens again, can be a symptom of a very serious condition, including cancer. There is no pain in the initial stages of many urinary cancers. Follow these instructions at home: Medicines  Take over-the-counter and prescription medicines only as told by your health care provider.  If you were prescribed an antibiotic medicine, take it as told by your health care provider. Do not stop taking the antibiotic even if you start to feel better. Eating and drinking  Drink enough fluid to keep your urine pale yellow. It is recommended that you drink 3-4 quarts (2.8-3.8 L) a day. If you have been diagnosed with an infection, drinking cranberry juice in addition to large amounts of water is recommended.  Avoid caffeine, tea, and carbonated beverages. These tend to irritate the bladder.  Avoid alcohol because  it may irritate the prostate (in males). General instructions  If you have been diagnosed with a kidney stone, follow your health care provider's instructions about straining your urine to catch the stone.  Empty your bladder often. Avoid holding urine for long periods of time.  If you are female: ? After a bowel movement, wipe from front to back and use each piece of toilet paper only once. ? Empty your bladder before and after sex.  Pay attention to any changes in your symptoms. Tell your health care provider about any changes or any new symptoms.  It is up to you to get the results of any tests. Ask your health care provider, or the department that is doing the test, when your results will be ready.  Keep all follow-up visits. This is important. Contact a health care provider if:  You develop back pain.  You have a fever or chills.  You have nausea or vomiting.  Your symptoms do not improve after 3 days.  Your symptoms get worse. Get help right away if:  You develop severe vomiting and are unable to take medicine without vomiting.  You develop severe pain in your back or abdomen even though you are taking medicine.  You pass a large amount of blood in your urine.  You pass blood clots in your urine.  You feel very weak or like you might faint.  You faint. Summary  Hematuria is blood in the urine. It has many possible causes.  It is very important that you tell  your health care provider about any blood in your urine, even if it is painless or the blood stops without treatment.  Take over-the-counter and prescription medicines only as told by your health care provider.  Drink enough fluid to keep your urine pale yellow. This information is not intended to replace advice given to you by your health care provider. Make sure you discuss any questions you have with your health care provider. Document Revised: 10/29/2019 Document Reviewed: 10/29/2019 Elsevier Patient  Education  2021 [employer]vier Inc.   Abdominal or Pelvic Ultrasound An ultrasound is a test that uses sound waves to take pictures of the inside of the body. An abdominal ultrasound takes pictures of the inside of your belly (abdomen). A pelvic ultrasound takes pictures of the inside of the area between your hip bones (pelvis). An ultrasound may be done to check an organ or look for problems. This is a safe test that does not hurt. It is done by placing a handheld device called a transducer on the outside of your belly or pelvis and moving it around. Tell your doctor about:  Any allergies you have.  All medicines you are taking.  Any surgeries you have had.  Any medical conditions you have.  Whether you are pregnant or may be pregnant. What are the risks? There are no known risks from having this test. What happens before the procedure?  Follow instructions from your doctor about eating or drinking before the test.  Wear clothing that is easy to wash. Gel from the test might get on your clothes. What happens during the procedure?  You will lie on an exam table.  Your clothes will be moved so your belly and pelvis are showing.  A gel will be put on your skin. It may feel cool.  The transducer device will be put on your skin. It will be moved back and forth over the area being looked at.  The device will take pictures. They will show on small TV screens.  You may be asked to change your position.  After the exam, the gel will be cleaned off.   What happens after the procedure?  It is up to you to get the results of your test. Ask your doctor, or the department that is doing the test, when your results will be ready.  Keep all follow-up visits as told by your doctor. This is important. Summary  An ultrasound is a test that uses sound waves to take pictures of the inside of the body.  An ultrasound may be done to check an organ or look for problems.  The test is done by  moving a handheld device around on the outside of your belly or pelvis.  It is up to you to get the results of your test. Be sure to ask when your results will be ready. This information is not intended to replace advice given to you by your health care provider. Make sure you discuss any questions you have with your health care provider. Document Revised: 09/24/2017 Document Reviewed: 09/24/2017 Elsevier Patient Education  2021 Elsevier Inc. Gallbladder Eating Plan If you have a gallbladder condition, you may have trouble digesting fats. Eating a low-fat diet can help reduce your symptoms, and may be helpful before and after having surgery to remove your gallbladder (cholecystectomy). Your health care provider may recommend that you work with a diet and nutrition specialist (dietitian) to help you reduce the amount of fat in your diet. What are tips  for following this plan? General guidelines  Limit your fat intake to less than 30% of your total daily calories. If you eat around 1,800 calories each day, this is less than 60 grams (g) of fat per day.  Fat is an important part of a healthy diet. Eating a low-fat diet can make it hard to maintain a healthy body weight. Ask your dietitian how much fat, calories, and other nutrients you need each day.  Eat small, frequent meals throughout the day instead of three large meals.  Drink at least 8-10 cups of fluid a day. Drink enough fluid to keep your urine clear or pale yellow.  Limit alcohol intake to no more than 1 drink a day for nonpregnant women and 2 drinks a day for men. One drink equals 12 oz of beer, 5 oz of wine, or 1 oz of hard liquor. Reading food labels  Check Nutrition Facts on food labels for the amount of fat per serving. Choose foods with less than 3 grams of fat per serving.   Shopping  Choose nonfat and low-fat healthy foods. Look for the words "nonfat," "low fat," or "fat free."  Avoid buying processed or prepackaged  foods. Cooking  Cook using low-fat methods, such as baking, broiling, grilling, or boiling.  Cook with small amounts of healthy fats, such as olive oil, grapeseed oil, canola oil, or sunflower oil. What foods are recommended?  All fresh, frozen, or canned fruits and vegetables.  Whole grains.  Low-fat or non-fat (skim) milk and yogurt.  Lean meat, skinless poultry, fish, eggs, and beans.  Low-fat protein supplement powders or drinks.  Spices and herbs. What foods are not recommended?  High-fat foods. These include baked goods, fast food, fatty cuts of meat, ice cream, french toast, sweet rolls, pizza, cheese bread, foods covered with butter, creamy sauces, or cheese.  Fried foods. These include french fries, tempura, battered fish, breaded chicken, fried breads, and sweets.  Foods with strong odors.  Foods that cause bloating and gas. Summary  A low-fat diet can be helpful if you have a gallbladder condition, or before and after gallbladder surgery.  Limit your fat intake to less than 30% of your total daily calories. This is about 60 g of fat if you eat 1,800 calories each day.  Eat small, frequent meals throughout the day instead of three large meals. This information is not intended to replace advice given to you by your health care provider. Make sure you discuss any questions you have with your health care provider. Document Revised: 10/16/2019 Document Reviewed: 10/16/2019 Elsevier Patient Education  2021 [employer].

## 2020-08-04 ENCOUNTER — Other Ambulatory Visit: Payer: Self-pay

## 2020-08-04 DIAGNOSIS — R112 Nausea with vomiting, unspecified: Secondary | ICD-10-CM

## 2020-08-04 LAB — AMYLASE: Amylase: 75 U/L (ref 31–110)

## 2020-08-04 LAB — LIPASE: Lipase: 46 U/L (ref 14–72)

## 2020-08-04 MED ORDER — ONDANSETRON 4 MG PO TBDP: 4.0000 mg | ORAL_TABLET | Freq: Three times a day (TID) | ORAL | 0 refills | Status: DC | PRN

## 2020-08-06 LAB — H PYLORI, IGM, IGG, IGA AB
H pylori, IgM Abs: <9 units (ref 0.0–8.9)
H. pylori, IgA Abs: <9 units (ref 0.0–8.9)
H. pylori, IgG AbS: 0.33 Index Value (ref 0.00–0.79)

## 2020-08-06 LAB — SPECIMEN STATUS REPORT

## 2020-08-10 ENCOUNTER — Telehealth: Payer: Self-pay

## 2020-08-10 DIAGNOSIS — Z006 Encounter for examination for normal comparison and control in clinical research program: Secondary | ICD-10-CM

## 2020-08-10 DIAGNOSIS — R1110 Vomiting, unspecified: Secondary | ICD-10-CM | POA: Diagnosis not present

## 2020-08-10 DIAGNOSIS — R1011 Right upper quadrant pain: Secondary | ICD-10-CM | POA: Diagnosis not present

## 2020-08-10 DIAGNOSIS — R112 Nausea with vomiting, unspecified: Secondary | ICD-10-CM | POA: Diagnosis not present

## 2020-08-10 NOTE — Telephone Encounter (Signed)
I have attempted without success to contact this patient by phone for her Identify 90 day follow up phone call. I left a message for patient to return my phone call with my name and callback number. An e-mail was also sent to patient.  

## 2020-08-11 ENCOUNTER — Other Ambulatory Visit: Payer: Self-pay | Admitting: Nurse Practitioner

## 2020-08-11 DIAGNOSIS — R112 Nausea with vomiting, unspecified: Secondary | ICD-10-CM

## 2020-08-22 ENCOUNTER — Other Ambulatory Visit: Payer: Self-pay | Admitting: Family Medicine

## 2020-08-22 ENCOUNTER — Other Ambulatory Visit: Payer: Self-pay | Admitting: Physician Assistant

## 2020-08-22 ENCOUNTER — Other Ambulatory Visit: Payer: Self-pay | Admitting: Nurse Practitioner

## 2020-08-22 DIAGNOSIS — F419 Anxiety disorder, unspecified: Secondary | ICD-10-CM

## 2020-08-22 DIAGNOSIS — F331 Major depressive disorder, recurrent, moderate: Secondary | ICD-10-CM

## 2020-08-22 DIAGNOSIS — F321 Major depressive disorder, single episode, moderate: Secondary | ICD-10-CM

## 2020-08-22 DIAGNOSIS — R112 Nausea with vomiting, unspecified: Secondary | ICD-10-CM

## 2020-08-22 DIAGNOSIS — K219 Gastro-esophageal reflux disease without esophagitis: Secondary | ICD-10-CM

## 2020-09-20 ENCOUNTER — Encounter: Payer: Self-pay | Admitting: Physician Assistant

## 2020-09-20 ENCOUNTER — Telehealth (INDEPENDENT_AMBULATORY_CARE_PROVIDER_SITE_OTHER): Payer: BC Managed Care – PPO | Admitting: Physician Assistant

## 2020-09-20 VITALS — BP 136/45 | HR 115 | Ht 61.0 in | Wt 124.0 lb

## 2020-09-20 DIAGNOSIS — R531 Weakness: Secondary | ICD-10-CM | POA: Diagnosis not present

## 2020-09-20 DIAGNOSIS — R5381 Other malaise: Secondary | ICD-10-CM | POA: Diagnosis not present

## 2020-09-20 DIAGNOSIS — Z5321 Procedure and treatment not carried out due to patient leaving prior to being seen by health care provider: Secondary | ICD-10-CM | POA: Diagnosis not present

## 2020-09-20 DIAGNOSIS — R1110 Vomiting, unspecified: Secondary | ICD-10-CM | POA: Insufficient documentation

## 2020-09-20 DIAGNOSIS — R112 Nausea with vomiting, unspecified: Secondary | ICD-10-CM
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
## 2020-09-22 ENCOUNTER — Other Ambulatory Visit: Payer: Self-pay | Admitting: Family Medicine

## 2020-09-22 ENCOUNTER — Other Ambulatory Visit: Payer: Self-pay | Admitting: Physician Assistant

## 2020-09-22 DIAGNOSIS — F331 Major depressive disorder, recurrent, moderate: Secondary | ICD-10-CM

## 2020-09-22 DIAGNOSIS — R112 Nausea with vomiting, unspecified: Secondary | ICD-10-CM

## 2020-09-23 ENCOUNTER — Other Ambulatory Visit: Payer: Self-pay | Admitting: Physician Assistant

## 2020-09-23 DIAGNOSIS — R112 Nausea with vomiting, unspecified: Secondary | ICD-10-CM

## 2020-09-23 DIAGNOSIS — F321 Major depressive disorder, single episode, moderate: Secondary | ICD-10-CM

## 2020-09-23 MED ORDER — ONDANSETRON 4 MG PO TBDP: 4.0000 mg | ORAL_TABLET | Freq: Three times a day (TID) | ORAL | 0 refills | Status: DC | PRN

## 2020-09-23 MED ORDER — VENLAFAXINE HCL ER 150 MG PO CP24: 150.0000 mg | ORAL_CAPSULE | Freq: Every day | ORAL | 0 refills | Status: DC

## 2020-09-27 ENCOUNTER — Other Ambulatory Visit: Payer: Self-pay

## 2020-09-27 DIAGNOSIS — R112 Nausea with vomiting, unspecified: Secondary | ICD-10-CM

## 2020-09-27 DIAGNOSIS — R1011 Right upper quadrant pain: Secondary | ICD-10-CM

## 2020-10-05 ENCOUNTER — Ambulatory Visit (INDEPENDENT_AMBULATORY_CARE_PROVIDER_SITE_OTHER): Payer: BC Managed Care – PPO | Admitting: Physician Assistant

## 2020-10-05 ENCOUNTER — Encounter: Payer: Self-pay | Admitting: Physician Assistant

## 2020-10-05 ENCOUNTER — Other Ambulatory Visit: Payer: Self-pay

## 2020-10-05 VITALS — BP 124/80 | HR 97 | Temp 97.0°F | Resp 16 | Ht 60.0 in | Wt 121.0 lb

## 2020-10-05 DIAGNOSIS — R1084 Generalized abdominal pain: Secondary | ICD-10-CM
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
## 2020-10-07 ENCOUNTER — Telehealth: Payer: Self-pay

## 2020-10-07 NOTE — Telephone Encounter (Signed)
Called pt. Pt VU.   Lorita Officer, [place] 10/07/20 1:31 PM

## 2020-10-07 NOTE — Telephone Encounter (Signed)
Pt calling due to sleep issues. Pt has only been getting about 3 hours of sleep at night. She has tried melatonin but is not helping. She states she did put this on paperwork at visit two days ago. Please advise.   Kimberly David, [place] 10/07/20 11:38 AM

## 2020-10-13 LAB — CBC WITH DIFFERENTIAL/PLATELET
Basophils Absolute: 0.1 10*3/uL (ref 0.0–0.2)
Basos: 1 %
EOS (ABSOLUTE): 0.3 10*3/uL (ref 0.0–0.4)
Eos: 4 %
Hematocrit: 39.2 % (ref 34.0–46.6)
Hemoglobin: 13.1 g/dL (ref 11.1–15.9)
Immature Grans (Abs): 0 10*3/uL (ref 0.0–0.1)
Immature Granulocytes: 0 %
Lymphocytes Absolute: 2.2 10*3/uL (ref 0.7–3.1)
Lymphs: 27 %
MCH: 31.2 pg (ref 26.6–33.0)
MCHC: 33.4 g/dL (ref 31.5–35.7)
MCV: 93 fL (ref 79–97)
Monocytes Absolute: 0.5 10*3/uL (ref 0.1–0.9)
Monocytes: 6 %
Neutrophils Absolute: 5.1 10*3/uL (ref 1.4–7.0)
Neutrophils: 62 %
Platelets: 300 10*3/uL (ref 150–450)
RBC: 4.2 x10E6/uL (ref 3.77–5.28)
RDW: 12.9 % (ref 11.7–15.4)
WBC: 8.1 10*3/uL (ref 3.4–10.8)

## 2020-10-13 LAB — COMPREHENSIVE METABOLIC PANEL
ALT: 16 IU/L (ref 0–32)
AST: 17 IU/L (ref 0–40)
Albumin/Globulin Ratio: 2 (ref 1.2–2.2)
Albumin: 4.5 g/dL (ref 3.8–4.9)
Alkaline Phosphatase: 98 IU/L (ref 44–121)
BUN/Creatinine Ratio: 19 (ref 12–28)
BUN: 15 mg/dL (ref 8–27)
Bilirubin Total: <0.2 mg/dL (ref 0.0–1.2)
CO2: 22 mmol/L (ref 20–29)
Calcium: 9 mg/dL (ref 8.7–10.3)
Chloride: 106 mmol/L (ref 96–106)
Creatinine, Ser: 0.78 mg/dL (ref 0.57–1.00)
Globulin, Total: 2.3 g/dL (ref 1.5–4.5)
Glucose: 143 mg/dL — ABNORMAL HIGH (ref 65–99)
Potassium: 4 mmol/L (ref 3.5–5.2)
Sodium: 144 mmol/L (ref 134–144)
Total Protein: 6.8 g/dL (ref 6.0–8.5)
eGFR: 87 mL/min/{1.73_m2} (ref >59)

## 2020-10-13 LAB — HEAVY METALS, BLOOD
Arsenic: 1 ug/L (ref 0–9)
Lead, Blood: <1 ug/dL (ref 0–4)
Mercury: <1 ug/L (ref 0.0–14.9)

## 2020-10-20 ENCOUNTER — Other Ambulatory Visit: Payer: Self-pay | Admitting: Physician Assistant

## 2020-10-20 DIAGNOSIS — F419 Anxiety disorder, unspecified: Secondary | ICD-10-CM

## 2020-10-26 DIAGNOSIS — Z6822 Body mass index (BMI) 22.0-22.9, adult: Secondary | ICD-10-CM | POA: Diagnosis not present

## 2020-10-26 DIAGNOSIS — Z01419 Encounter for gynecological examination (general) (routine) without abnormal findings: Secondary | ICD-10-CM | POA: Diagnosis not present

## 2020-10-26 DIAGNOSIS — R87612 Low grade squamous intraepithelial lesion on cytologic smear of cervix (LGSIL): Secondary | ICD-10-CM | POA: Diagnosis not present

## 2020-10-26 DIAGNOSIS — Z124 Encounter for screening for malignant neoplasm of cervix: Secondary | ICD-10-CM | POA: Diagnosis not present

## 2020-10-26 LAB — RESULTS CONSOLE HPV: CHL HPV: NEGATIVE

## 2020-10-26 LAB — HM PAP SMEAR

## 2020-11-21 ENCOUNTER — Other Ambulatory Visit: Payer: Self-pay | Admitting: Family Medicine

## 2020-12-07 ENCOUNTER — Other Ambulatory Visit: Payer: Self-pay | Admitting: Physician Assistant

## 2020-12-07 ENCOUNTER — Other Ambulatory Visit: Payer: Self-pay | Admitting: Legal Medicine

## 2020-12-07 DIAGNOSIS — F419 Anxiety disorder, unspecified: Secondary | ICD-10-CM

## 2020-12-12 ENCOUNTER — Other Ambulatory Visit: Payer: Self-pay | Admitting: Physician Assistant

## 2020-12-12 DIAGNOSIS — R112 Nausea with vomiting, unspecified: Secondary | ICD-10-CM

## 2020-12-28 ENCOUNTER — Other Ambulatory Visit: Payer: Self-pay | Admitting: Physician Assistant

## 2020-12-28 DIAGNOSIS — R112 Nausea with vomiting, unspecified: Secondary | ICD-10-CM

## 2020-12-29 ENCOUNTER — Telehealth: Payer: Self-pay

## 2020-12-29 ENCOUNTER — Other Ambulatory Visit: Payer: Self-pay

## 2020-12-29 DIAGNOSIS — F321 Major depressive disorder, single episode, moderate: Secondary | ICD-10-CM

## 2020-12-29 MED ORDER — VENLAFAXINE HCL ER 150 MG PO CP24: 150.0000 mg | ORAL_CAPSULE | Freq: Every day | ORAL | 0 refills | Status: DC

## 2020-12-29 NOTE — Telephone Encounter (Signed)
Pt returned call. Pt very upset, crying. Could understand pt lost family member and she is very upset. She has been without effexor for a few days.   Made 30 minute appointment for Friday.   Lorita Officer, [place] 12/29/20 4:29 PM

## 2020-12-29 NOTE — Telephone Encounter (Signed)
Called pt she is due for follow up. No answer, left VM for return call for appointment.   Lorita Officer, [place] 12/29/20 3:04 PM

## 2020-12-31 ENCOUNTER — Other Ambulatory Visit: Payer: Self-pay

## 2020-12-31 ENCOUNTER — Ambulatory Visit (INDEPENDENT_AMBULATORY_CARE_PROVIDER_SITE_OTHER): Payer: BC Managed Care – PPO | Admitting: Physician Assistant

## 2020-12-31 ENCOUNTER — Ambulatory Visit (INDEPENDENT_AMBULATORY_CARE_PROVIDER_SITE_OTHER): Payer: BC Managed Care – PPO

## 2020-12-31 ENCOUNTER — Encounter: Payer: Self-pay | Admitting: Physician Assistant

## 2020-12-31 VITALS — BP 132/84 | HR 104 | Temp 98.1°F | Ht 62.0 in | Wt 120.0 lb

## 2020-12-31 DIAGNOSIS — R002 Palpitations: Secondary | ICD-10-CM

## 2020-12-31 DIAGNOSIS — Z1159 Encounter for screening for other viral diseases: Secondary | ICD-10-CM

## 2020-12-31 DIAGNOSIS — Z23 Encounter for immunization: Secondary | ICD-10-CM

## 2020-12-31 DIAGNOSIS — E782 Mixed hyperlipidemia: Secondary | ICD-10-CM | POA: Diagnosis not present

## 2020-12-31 DIAGNOSIS — F419 Anxiety disorder, unspecified: Secondary | ICD-10-CM

## 2020-12-31 DIAGNOSIS — F321 Major depressive disorder, single episode, moderate: Secondary | ICD-10-CM

## 2020-12-31 MED ORDER — VENLAFAXINE HCL ER 150 MG PO CP24: 150.0000 mg | ORAL_CAPSULE | Freq: Every day | ORAL | 1 refills | Status: DC

## 2020-12-31 NOTE — Progress Notes (Signed)
Subjective:  Patient ID: Kimberly David, female    DOB: 10/30/1960  Age: 60 y.o. MRN: [id number]  Chief Complaint  Patient presents with      Hyperlipidemia   Gastroesophageal Reflux   Depression    HPI Pt with history of palpitations - currently on toprol XL 50mg  qd which she states controls her symptoms  Mixed hyperlipidemia  Pt presents with hyperlipidemia. Compliance with treatment has been good The patient is compliant with medications, maintains a low cholesterol diet , follows up as directed , and maintains an exercise regimen . The patient denies experiencing any hypercholesterolemia related symptoms. Current med is lipitor 20mg  qd  Pt with history of anxiety - states she is stressed at work and at home - currently on effexor XR 150mg  and ran out a week ago - has not picked up rx yet --- she also uses xanax as needed  Pt with history of GERD - stable on prilosec 20mg  qd  Pt would like to be screened for Hep C Pt would like flu shot today   Current Outpatient Medications on File Prior to Visit  Medication Sig Dispense Refill   albuterol (VENTOLIN HFA) 108 (90 Base) MCG/ACT inhaler Inhale 1-2 puffs into the lungs every 4 (four) hours as needed for shortness of breath.     ALPRAZolam (XANAX) 0.25 MG tablet TAKE 1 TABLET BY MOUTH EVERY DAY AT BEDTIME AS NEEDED 30 tablet 0   aspirin EC 81 MG tablet Take 1 tablet (81 mg total) by mouth daily. Swallow whole. 90 tablet 3   atorvastatin (LIPITOR) 20 MG tablet TAKE 1 TABLET BY MOUTH EVERY DAY 90 tablet 0   FYAVOLV 1-5 MG-MCG TABS tablet Take 1 tablet by mouth daily.     meclizine (ANTIVERT) 12.5 MG tablet TAKE 2 TABLET EVERY 6 HOURS AS NEEDED FOR DIZZINESS. 60 tablet 0   meloxicam (MOBIC) 15 MG tablet TAKE 1 TABLET BY MOUTH EVERY DAY 30 tablet 2   metoprolol succinate (TOPROL-XL) 50 MG 24 hr tablet TAKE 1 TABLET BY MOUTH EVERYDAY AT BEDTIME 90 tablet 1   nitroGLYCERIN (NITROSTAT) 0.4 MG SL tablet Place 0.4 mg under the  tongue every 5 (five) minutes as needed for chest pain.     omeprazole (PRILOSEC) 20 MG capsule TAKE 1 CAPSULE BY MOUTH EVERY DAY 90 capsule 1   ondansetron (ZOFRAN ODT) 4 MG disintegrating tablet Take 1 tablet (4 mg total) by mouth every 8 (eight) hours as needed for nausea or vomiting. 20 tablet 0   tolterodine (DETROL LA) 2 MG 24 hr capsule Take 2 mg by mouth daily.     No current facility-administered medications on file prior to visit.   Past Medical History:  Diagnosis Date   Abnormal blood chemistry 10/27/2019   Angina pectoris (HCC) 01/30/2020   Anxiety    Cardiac murmur 05/09/2019   COVID-19    Depression    Depression, major, single episode, moderate (HCC) 05/06/2019   Easy bruising 10/27/2019   Eustachian tube disorder 12/23/2019   Ganglion cyst of wrist    Rt   History of severe acute respiratory syndrome coronavirus 2 (SARS-CoV-2) disease 03/02/2019   Hyperlipidemia    takes lipitor   Mixed hyperlipidemia 05/06/2019   Moderate recurrent major depression (HCC) 06/16/2019   Need for tetanus, diphtheria, and acellular pertussis (Tdap) vaccine 03/17/2020   Other fatigue 07/16/2019   Otitis media 12/23/2019   Palpitation 05/06/2019   Palpitations 05/09/2019   Past Surgical History:  Procedure Laterality Date  CESAREAN SECTION  02/08/2001   GANGLION CYST EXCISION  01/11/2012   Procedure: REMOVAL GANGLION OF WRIST;  Surgeon: Tami Ribas, MD;  Location: [facility];  Service: Orthopedics;  Laterality: Right;  right wrist excision mass     Family History  Problem Relation Age of Onset   Hyperlipidemia Mother    Heart attack Mother    Migraines Mother    Lung cancer Father    Diabetes type II Sister    Migraines Sister    Diabetes type I Daughter    Social History   Socioeconomic History   Marital status: Legally Separated    Spouse name: Not on file   Number of children: 3   Years of education: Not on file   Highest education level: Not on file   Occupational History   Not on file  Tobacco Use   Smoking status: Never   Smokeless tobacco: Never  Vaping Use   Vaping Use: Never used  Substance and Sexual Activity   Alcohol use: No   Drug use: No   Sexual activity: Yes    Birth control/protection: Post-menopausal  Other Topics Concern   Not on file  Social History Narrative   Not on file   Social Determinants of Health   Financial Resource Strain: Not on file  Food Insecurity: Not on file  Transportation Needs: Not on file  Physical Activity: Not on file  Stress: Not on file  Social Connections: Not on file    Review of Systems  CONSTITUTIONAL: Negative for chills, fatigue, fever, unintentional weight gain and unintentional weight loss.  CARDIOVASCULAR: Negative for chest pain, dizziness, palpitations and pedal edema.  RESPIRATORY: Negative for recent cough and dyspnea.  INTEGUMENTARY: Negative for rash.  NEUROLOGICAL: Negative for dizziness and headaches.  PSYCHIATRIC: see HPI     Objective:  BP 132/84   Pulse (!) 104   Temp 98.1 F (36.7 C)   Ht 5\' 2"  (1.575 m)   Wt 120 lb (54.4 kg)   SpO2 97%   BMI 21.95 kg/m   BP/Weight 12/31/2020 10/05/2020 09/20/2020  Systolic BP 132 124 136  Diastolic BP 84 80 45  Wt. (Lbs) 120 121 124  BMI 21.95 23.63 23.43    Physical Exam PHYSICAL EXAM:   VS: BP 132/84   Pulse (!) 104   Temp 98.1 F (36.7 C)   Ht 5\' 2"  (1.575 m)   Wt 120 lb (54.4 kg)   SpO2 97%   BMI 21.95 kg/m   GEN: Well nourished, well developed, in no acute distress  Cardiac: RRR; no murmurs, rubs, or gallops,no edema - no significant varicosities Respiratory:  normal respiratory rate and pattern with no distress - normal breath sounds with no rales, rhonchi, wheezes or rubs MS: no deformity or atrophy  Skin: warm and dry, no rash  Neuro:  Alert and Oriented x 3,  CN II-Xii grossly intact Psych: euthymic mood, appropriate affect and demeanor  Diabetic Foot Exam - Simple   No data filed       Lab Results  Component Value Date   WBC 8.1 10/05/2020   HGB 13.1 10/05/2020   HCT 39.2 10/05/2020   PLT 300 10/05/2020   GLUCOSE 143 (H) 10/05/2020   CHOL 169 03/17/2020   TRIG 131 03/17/2020   HDL 49 03/17/2020   LDLCALC 97 03/17/2020   ALT 16 10/05/2020   AST 17 10/05/2020   NA 144 10/05/2020   K 4.0 10/05/2020   CL 106  10/05/2020   CREATININE 0.78 10/05/2020   BUN 15 10/05/2020   CO2 22 10/05/2020   TSH 2.130 03/17/2020   INR 1.1 05/26/2020      Assessment & Plan:   Problem List Items Addressed This Visit       Other   Mixed hyperlipidemia (Chronic)   Relevant Orders   Lipid panel Continue meds and diet   Depression, major, single episode, moderate (HCC)   Relevant Medications   venlafaxine XR (EFFEXOR-XR) 150 MG 24 hr capsule   Anxiety   Relevant Medications   venlafaxine XR (EFFEXOR-XR) 150 MG 24 hr capsule   Palpitations   Relevant Orders   CBC with Differential/Platelet   Comprehensive metabolic panel   TSH   Other Visit Diagnoses     Need for prophylactic vaccination and inoculation against influenza    -  Primary   Relevant Orders   Flu Vaccine MDCK QUAD PF   Need for hepatitis C screening test       Relevant Orders   Hepatitis C antibody     .  Meds ordered this encounter  Medications   venlafaxine XR (EFFEXOR-XR) 150 MG 24 hr capsule    Sig: Take 1 capsule (150 mg total) by mouth daily with breakfast.    Dispense:  90 capsule    Refill:  1    Orders Placed This Encounter  Procedures   Flu Vaccine MDCK QUAD PF   CBC with Differential/Platelet   Comprehensive metabolic panel   TSH   Lipid panel   Hepatitis C antibody     Follow-up: Return in about 6 months (around 07/01/2021) for chronic fasting follow up.  An After Visit Summary was printed and given to the patient.  Jettie Pagan [facility] [phone]

## 2020-12-31 NOTE — Progress Notes (Signed)
   Covid-19 Vaccination Clinic  Name:  Kimberly David    MRN: [id number] DOB: 02-06-1961  12/31/2020  Kimberly David was observed post Covid-19 immunization for 15 minutes without incident. She was provided with Vaccine Information Sheet and instruction to access the V-Safe system.   Kimberly David was instructed to call 911 with any severe reactions post vaccine: Difficulty breathing  Swelling of face and throat  A fast heartbeat  A bad rash all over body  Dizziness and weakness   Immunizations Administered     Name Date Dose VIS Date Route   Moderna Covid-19 vaccine Bivalent Booster 12/31/2020 11:00 AM 0.5 mL 10/23/2020 Intramuscular   Manufacturer: Moderna   Lot: [id number]   NDC: 42876-811-57

## 2021-01-01 LAB — CBC WITH DIFFERENTIAL/PLATELET
Basophils Absolute: 0.1 10*3/uL (ref 0.0–0.2)
Basos: 2 %
EOS (ABSOLUTE): 0.3 10*3/uL (ref 0.0–0.4)
Eos: 4 %
Hematocrit: 42.9 % (ref 34.0–46.6)
Hemoglobin: 14.7 g/dL (ref 11.1–15.9)
Immature Grans (Abs): 0 10*3/uL (ref 0.0–0.1)
Immature Granulocytes: 0 %
Lymphocytes Absolute: 1.5 10*3/uL (ref 0.7–3.1)
Lymphs: 19 %
MCH: 32.1 pg (ref 26.6–33.0)
MCHC: 34.3 g/dL (ref 31.5–35.7)
MCV: 94 fL (ref 79–97)
Monocytes Absolute: 0.5 10*3/uL (ref 0.1–0.9)
Monocytes: 6 %
Neutrophils Absolute: 5.4 10*3/uL (ref 1.4–7.0)
Neutrophils: 69 %
Platelets: 318 10*3/uL (ref 150–450)
RBC: 4.58 x10E6/uL (ref 3.77–5.28)
RDW: 11.9 % (ref 11.7–15.4)
WBC: 7.8 10*3/uL (ref 3.4–10.8)

## 2021-01-01 LAB — TSH: TSH: 1.44 u[IU]/mL (ref 0.450–4.500)

## 2021-01-01 LAB — COMPREHENSIVE METABOLIC PANEL
ALT: 16 IU/L (ref 0–32)
AST: 27 IU/L (ref 0–40)
Albumin/Globulin Ratio: 1.9 (ref 1.2–2.2)
Albumin: 4.8 g/dL (ref 3.8–4.8)
Alkaline Phosphatase: 111 IU/L (ref 44–121)
BUN/Creatinine Ratio: 24 (ref 12–28)
BUN: 20 mg/dL (ref 8–27)
Bilirubin Total: 0.4 mg/dL (ref 0.0–1.2)
CO2: 21 mmol/L (ref 20–29)
Calcium: 9.9 mg/dL (ref 8.7–10.3)
Chloride: 102 mmol/L (ref 96–106)
Creatinine, Ser: 0.84 mg/dL (ref 0.57–1.00)
Globulin, Total: 2.5 g/dL (ref 1.5–4.5)
Glucose: 131 mg/dL — ABNORMAL HIGH (ref 70–99)
Potassium: 4.1 mmol/L (ref 3.5–5.2)
Sodium: 143 mmol/L (ref 134–144)
Total Protein: 7.3 g/dL (ref 6.0–8.5)
eGFR: 79 mL/min/{1.73_m2} (ref >59)

## 2021-01-01 LAB — LIPID PANEL
Chol/HDL Ratio: 3.8 ratio (ref 0.0–4.4)
Cholesterol, Total: 204 mg/dL — ABNORMAL HIGH (ref 100–199)
HDL: 54 mg/dL (ref >39)
LDL Chol Calc (NIH): 126 mg/dL — ABNORMAL HIGH (ref 0–99)
Triglycerides: 138 mg/dL (ref 0–149)
VLDL Cholesterol Cal: 24 mg/dL (ref 5–40)

## 2021-01-01 LAB — HEPATITIS C ANTIBODY: Hep C Virus Ab: <0.1 s/co ratio (ref 0.0–0.9)

## 2021-01-01 LAB — CARDIOVASCULAR RISK ASSESSMENT

## 2021-01-03 ENCOUNTER — Other Ambulatory Visit: Payer: Self-pay | Admitting: Physician Assistant

## 2021-01-03 DIAGNOSIS — R7309 Other abnormal glucose: Secondary | ICD-10-CM

## 2021-01-04 LAB — HGB A1C W/O EAG: Hgb A1c MFr Bld: 5.7 % — ABNORMAL HIGH (ref 4.8–5.6)

## 2021-01-04 LAB — SPECIMEN STATUS REPORT

## 2021-01-10 ENCOUNTER — Other Ambulatory Visit: Payer: Self-pay | Admitting: Physician Assistant

## 2021-01-10 DIAGNOSIS — F419 Anxiety disorder, unspecified: Secondary | ICD-10-CM

## 2021-01-26 IMAGING — CT CT HEART MORP W/ CTA COR W/ SCORE W/ CA W/CM &/OR W/O CM
4 of 7 series · 8 of 20 positions shown, 9 images · IV contrast (omnipaque)
Comparison: None.
COMPARISON: None.

Addendum:
EXAM:
OVER-READ INTERPRETATION  CT CHEST

The following report is an over-read performed by radiologist Dr.
Kester Chow [REDACTED] on 03/08/2020. This
over-read does not include interpretation of cardiac or coronary
anatomy or pathology. The coronary calcium score/coronary CTA
interpretation by the cardiologist is attached.
HISTORY: Chest pain/anginal equiv, ECGs and troponins normal
Cardiac/Coronary CT
TECHNIQUE: The patient was scanned on a Siemens Force scanner.
PROTOCOL: A 110 kV prospective scan was triggered in the descending thoracic
aorta at 111 HU's. Axial non-contrast 3 mm slices were carried out
through the heart. The data set was analyzed on a dedicated work
station and scored using the Agatson method. Gantry rotation speed
was 250 msecs and collimation was 0.6 mm. Beta blockade and 0.8 mg
of sl NTG was given. The 3D data set was reconstructed in 5%
intervals of 35-75% of the R-R cycle. Diastolic phases were analyzed
on a dedicated work station using MPR, MIP and VRT modes. The
patient received 80mL OMNIPAQUE IOHEXOL 350 MG/ML SOLN of contrast.

[Series 6: best diast 71 % · axial · 0.39mm/px · z∈[+1160,+1205]mm · 2 of 343 slices shown]
[im 115/343  vessel]
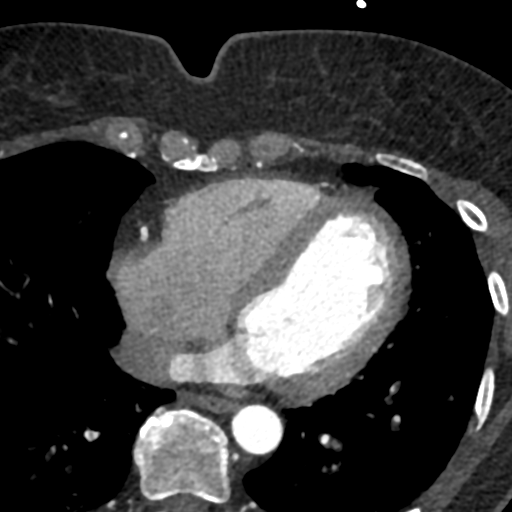
[im 229/343  vessel]
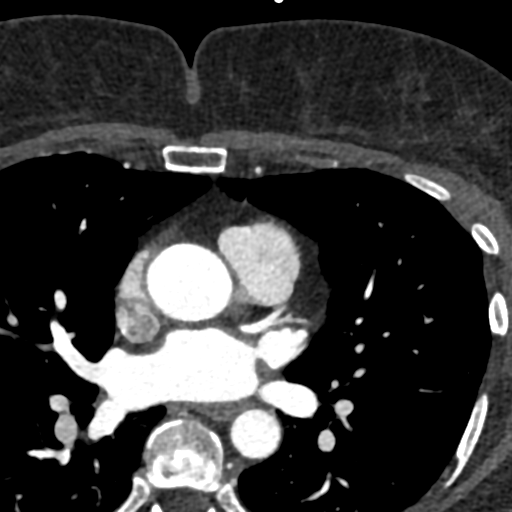

[Series 7: best syst · axial · 0.39mm/px · z∈[+1160,+1205]mm · 2 of 343 slices shown, 3 images]
[im 115/343  vessel]
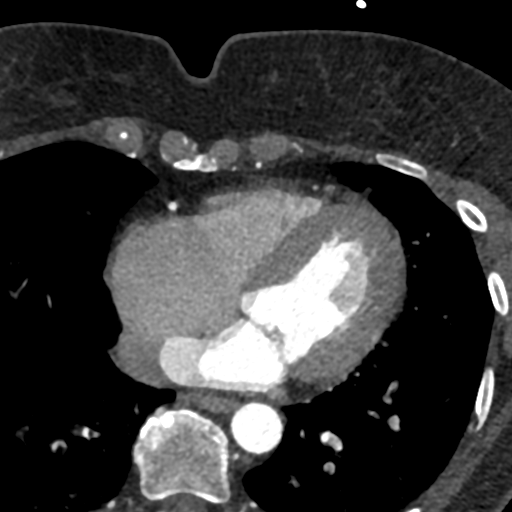
[im 115/343  lung]
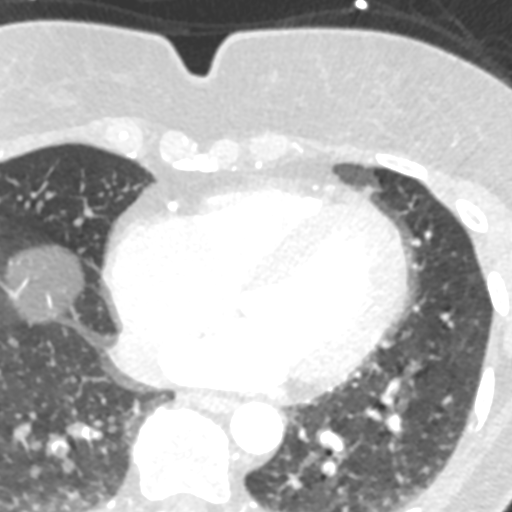
[im 229/343  vessel]
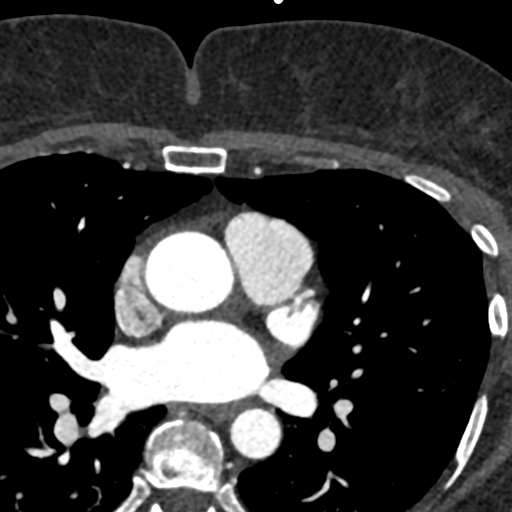

[Series 8: ts diast sharp 71 % · axial · 0.39mm/px · z∈[+1160,+1205]mm · 2 of 343 slices shown]
[im 115/343  lung]
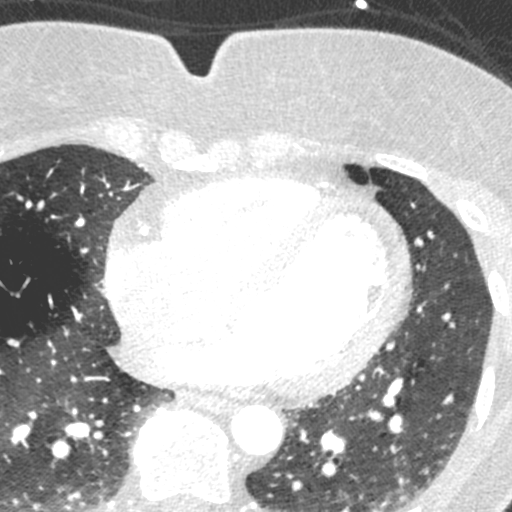
[im 229/343  lung]
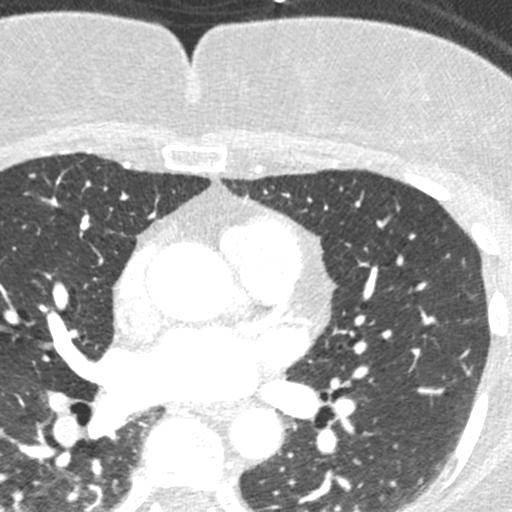

[Series 9: ts syst sharp · axial · 0.39mm/px · z∈[+1160,+1205]mm · 2 of 343 slices shown]
[im 115/343  lung]
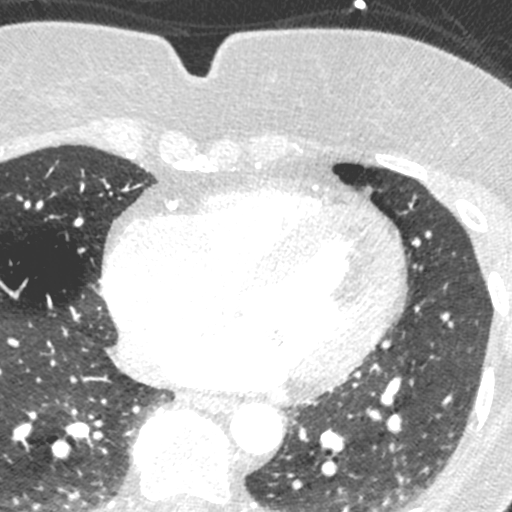
[im 229/343  lung]
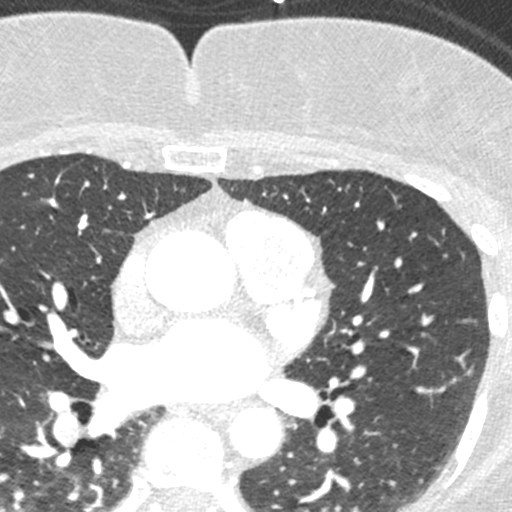

[8 of 20 positions shown; findings below may reference images not displayed]

FINDINGS: Within the visualized portions of the thorax there are no suspicious
appearing pulmonary nodules or masses, there is no acute
consolidative airspace disease, no pleural effusions, no
pneumothorax and no lymphadenopathy. Visualized portions of the
upper abdomen are unremarkable. There are no aggressive appearing
lytic or blastic lesions noted in the visualized portions of the
skeleton.
IMPRESSION: No significant incidental noncardiac findings are noted.
FINDINGS: Coronary calcium score: The patient's coronary artery calcium score
is 0, which places the patient in the 0 percentile.

Coronary arteries: Normal coronary origins.  Right dominance.

Right Coronary Artery: Normal caliber vessel, gives rise to PDA. No
significant plaque or stenosis.

Left Main Coronary Artery: Normal caliber vessel. No significant
plaque or stenosis.

Left Anterior Descending Coronary Artery: Normal caliber vessel. No
significant plaque or stenosis. Gives rise to 2 diagonal branches.
Distal LAD wraps apex.

Left Circumflex Artery: Normal caliber vessel. No significant plaque
or stenosis. Gives rise to 1 OM branch.

Aorta: Normal size, 33 mm at the mid ascending aorta (level of the
PA bifurcation) measured double oblique. No calcifications. No
dissection.

Aortic Valve: No calcifications. Trileaflet.

Other findings:

Normal pulmonary vein drainage into the left atrium. The right
upper, right middle, and right lower pulmonary veins drain into a
common trunk (normal variant).

Normal left atrial appendage without a thrombus.

Normal size of the pulmonary artery.

Small PFO with left to right flow.
IMPRESSION: 1. No evidence of CAD, CADRADS = 0.

2. Coronary calcium score of 0. This was 0 percentile for age and
sex matched control.

3. Normal coronary origin with right dominance.

4.  Small PFO with left to right flow.

*** End of Addendum ***
EXAM:
OVER-READ INTERPRETATION  CT CHEST

The following report is an over-read performed by radiologist Dr.
Kester Chow [REDACTED] on 03/08/2020. This
over-read does not include interpretation of cardiac or coronary
anatomy or pathology. The coronary calcium score/coronary CTA
interpretation by the cardiologist is attached.
FINDINGS: Within the visualized portions of the thorax there are no suspicious
appearing pulmonary nodules or masses, there is no acute
consolidative airspace disease, no pleural effusions, no
pneumothorax and no lymphadenopathy. Visualized portions of the
upper abdomen are unremarkable. There are no aggressive appearing
lytic or blastic lesions noted in the visualized portions of the
skeleton.
IMPRESSION: No significant incidental noncardiac findings are noted.

## 2021-02-17 ENCOUNTER — Other Ambulatory Visit: Payer: Self-pay | Admitting: Physician Assistant

## 2021-02-20 ENCOUNTER — Other Ambulatory Visit: Payer: Self-pay | Admitting: Nurse Practitioner

## 2021-02-20 ENCOUNTER — Other Ambulatory Visit: Payer: Self-pay | Admitting: Physician Assistant

## 2021-02-20 DIAGNOSIS — K219 Gastro-esophageal reflux disease without esophagitis: Secondary | ICD-10-CM

## 2021-02-20 DIAGNOSIS — R112 Nausea with vomiting, unspecified: Secondary | ICD-10-CM

## 2021-02-27 ENCOUNTER — Other Ambulatory Visit: Payer: Self-pay | Admitting: Physician Assistant

## 2021-02-27 DIAGNOSIS — F419 Anxiety disorder, unspecified: Secondary | ICD-10-CM

## 2021-03-03 ENCOUNTER — Other Ambulatory Visit: Payer: Self-pay | Admitting: Physician Assistant

## 2021-05-13 ENCOUNTER — Other Ambulatory Visit: Payer: Self-pay | Admitting: Physician Assistant

## 2021-05-13 DIAGNOSIS — F419 Anxiety disorder, unspecified: Secondary | ICD-10-CM

## 2021-05-29 ENCOUNTER — Other Ambulatory Visit: Payer: Self-pay | Admitting: Legal Medicine

## 2021-05-29 ENCOUNTER — Other Ambulatory Visit: Payer: Self-pay | Admitting: Physician Assistant

## 2021-06-21 ENCOUNTER — Other Ambulatory Visit: Payer: Self-pay | Admitting: Physician Assistant

## 2021-06-21 DIAGNOSIS — F419 Anxiety disorder, unspecified: Secondary | ICD-10-CM

## 2021-07-01 ENCOUNTER — Ambulatory Visit: Payer: BC Managed Care – PPO | Admitting: Physician Assistant

## 2021-07-11 ENCOUNTER — Other Ambulatory Visit: Payer: Self-pay | Admitting: Physician Assistant

## 2021-07-11 DIAGNOSIS — F321 Major depressive disorder, single episode, moderate: Secondary | ICD-10-CM

## 2021-07-11 MED ORDER — VENLAFAXINE HCL ER 150 MG PO CP24: 150.0000 mg | ORAL_CAPSULE | Freq: Every day | ORAL | 0 refills | Status: DC

## 2021-07-15 ENCOUNTER — Ambulatory Visit (INDEPENDENT_AMBULATORY_CARE_PROVIDER_SITE_OTHER): Payer: BC Managed Care – PPO | Admitting: Physician Assistant

## 2021-07-15 ENCOUNTER — Encounter: Payer: Self-pay | Admitting: Physician Assistant

## 2021-07-15 VITALS — BP 124/80 | HR 102 | Temp 98.4°F | Resp 15 | Ht 62.0 in | Wt 125.0 lb

## 2021-07-15 DIAGNOSIS — R7309 Other abnormal glucose: Secondary | ICD-10-CM | POA: Diagnosis not present

## 2021-07-15 DIAGNOSIS — Z23 Encounter for immunization: Secondary | ICD-10-CM

## 2021-07-15 DIAGNOSIS — F419 Anxiety disorder, unspecified: Secondary | ICD-10-CM | POA: Diagnosis not present

## 2021-07-15 DIAGNOSIS — F331 Major depressive disorder, recurrent, moderate: Secondary | ICD-10-CM | POA: Diagnosis not present

## 2021-07-15 DIAGNOSIS — E782 Mixed hyperlipidemia: Secondary | ICD-10-CM | POA: Diagnosis not present

## 2021-07-15 DIAGNOSIS — K219 Gastro-esophageal reflux disease without esophagitis: Secondary | ICD-10-CM | POA: Diagnosis not present

## 2021-07-15 DIAGNOSIS — G8929 Other chronic pain: Secondary | ICD-10-CM | POA: Diagnosis not present

## 2021-07-15 DIAGNOSIS — Z1211 Encounter for screening for malignant neoplasm of colon: Secondary | ICD-10-CM

## 2021-07-15 DIAGNOSIS — R519 Headache, unspecified: Secondary | ICD-10-CM | POA: Diagnosis not present

## 2021-07-15 NOTE — Assessment & Plan Note (Signed)
Continue meds  ?labwork pending ?

## 2021-07-15 NOTE — Assessment & Plan Note (Signed)
Referral made to Dr Charm Barges ?

## 2021-07-15 NOTE — Assessment & Plan Note (Signed)
Shingrix #1 given.

## 2021-07-15 NOTE — Assessment & Plan Note (Signed)
Continue meds. 

## 2021-07-15 NOTE — Assessment & Plan Note (Signed)
CT head ordered

## 2021-07-15 NOTE — Progress Notes (Signed)
? ?Subjective:  ?Patient ID: Kimberly David, female    DOB: Oct 25, 1960  Age: 61 y.o. MRN: [id number] ? ?Chief Complaint  ?Patient presents with  ? Hyperlipidemia  ? Depression  ? ? ?HPI ? Mixed hyperlipidemia  ?Pt presents with hyperlipidemia.  Compliance with treatment has been good The patient is compliant with medications, maintains a low cholesterol diet , follows up as directed , and maintains an exercise regimen . The patient denies experiencing any hypercholesterolemia related symptoms. Pt currently taking lipitor 20mg  qd ? ?Pt with history of depression and anxiety - stable on current meds of effexor-xr 150mg  and alprazolam ? ?Pt with history of GERD - stable on prilosec 20mg  qd ? ?Pt with history of headaches - she states she used to have headaches when she was younger but now the headaches are causing nausea and seeing 'squiggly' lines when she has them - can rest and take otc meds to help them resolve ? ?Pt would like shingrix vaccine series ?Pt would like to schedule for screening colonoscopy ? ? ?Current Outpatient Medications on File Prior to Visit  ?Medication Sig Dispense Refill  ? albuterol (VENTOLIN HFA) 108 (90 Base) MCG/ACT inhaler Inhale 1-2 puffs into the lungs every 4 (four) hours as needed for shortness of breath.    ? ALPRAZolam (XANAX) 0.25 MG tablet TAKE 1 TABLET BY MOUTH EVERY DAY AT BEDTIME AS NEEDED 30 tablet 0  ? aspirin EC 81 MG tablet Take 1 tablet (81 mg total) by mouth daily. Swallow whole. 90 tablet 3  ? atorvastatin (LIPITOR) 20 MG tablet TAKE 1 TABLET BY MOUTH EVERY DAY 90 tablet 0  ? FYAVOLV 1-5 MG-MCG TABS tablet Take 1 tablet by mouth daily.    ? meclizine (ANTIVERT) 12.5 MG tablet TAKE 2 TABLET EVERY 6 HOURS AS NEEDED FOR DIZZINESS. 60 tablet 0  ? meloxicam (MOBIC) 15 MG tablet TAKE 1 TABLET BY MOUTH EVERY DAY 30 tablet 2  ? metoprolol succinate (TOPROL-XL) 50 MG 24 hr tablet TAKE 1 TABLET BY MOUTH EVERYDAY AT BEDTIME 90 tablet 1  ? MYRBETRIQ 25 MG TB24 tablet Take 25  mg by mouth daily.    ? nitroGLYCERIN (NITROSTAT) 0.4 MG SL tablet Place 0.4 mg under the tongue every 5 (five) minutes as needed for chest pain.    ? omeprazole (PRILOSEC) 20 MG capsule TAKE 1 CAPSULE BY MOUTH EVERY DAY 90 capsule 1  ? venlafaxine XR (EFFEXOR-XR) 150 MG 24 hr capsule Take 1 capsule (150 mg total) by mouth daily with breakfast. 90 capsule 0  ? ?No current facility-administered medications on file prior to visit.  ? ?Past Medical History:  ?Diagnosis Date  ? Abnormal blood chemistry 10/27/2019  ? Angina pectoris (HCC) 01/30/2020  ? Anxiety   ? Cardiac murmur 05/09/2019  ? COVID-19   ? Depression   ? Depression, major, single episode, moderate (HCC) 05/06/2019  ? Easy bruising 10/27/2019  ? Eustachian tube disorder 12/23/2019  ? Ganglion cyst of wrist   ? Rt  ? History of severe acute respiratory syndrome coronavirus 2 (SARS-CoV-2) disease 03/02/2019  ? Hyperlipidemia   ? takes lipitor  ? Mixed hyperlipidemia 05/06/2019  ? Moderate recurrent major depression (HCC) 06/16/2019  ? Need for tetanus, diphtheria, and acellular pertussis (Tdap) vaccine 03/17/2020  ? Other fatigue 07/16/2019  ? Otitis media 12/23/2019  ? Palpitation 05/06/2019  ? Palpitations 05/09/2019  ? ?Past Surgical History:  ?Procedure Laterality Date  ? CESAREAN SECTION  02/08/2001  ? GANGLION CYST EXCISION  01/11/2012  ?  Procedure: REMOVAL GANGLION OF WRIST;  Surgeon: Tami Ribas, MD;  Location: [facility];  Service: Orthopedics;  Laterality: Right;  right wrist excision mass   ?  ?Family History  ?Problem Relation Age of Onset  ? Hyperlipidemia Mother   ? Heart attack Mother   ? Migraines Mother   ? Lung cancer Father   ? Diabetes type II Sister   ? Migraines Sister   ? Diabetes type I Daughter   ? ?Social History  ? ?Socioeconomic History  ? Marital status: Legally Separated  ?  Spouse name: Not on file  ? Number of children: 3  ? Years of education: Not on file  ? Highest education level: Not on file  ?Occupational History  ?  Not on file  ?Tobacco Use  ? Smoking status: Never  ? Smokeless tobacco: Never  ?Vaping Use  ? Vaping Use: Never used  ?Substance and Sexual Activity  ? Alcohol use: No  ? Drug use: No  ? Sexual activity: Yes  ?  Birth control/protection: Post-menopausal  ?Other Topics Concern  ? Not on file  ?Social History Narrative  ? Not on file  ? ?Social Determinants of Health  ? ?Financial Resource Strain: Not on file  ?Food Insecurity: Not on file  ?Transportation Needs: Not on file  ?Physical Activity: Not on file  ?Stress: Not on file  ?Social Connections: Not on file  ? ? ?Review of Systems ?CONSTITUTIONAL: Negative for chills, fatigue, fever, unintentional weight gain and unintentional weight loss.  ? ?CARDIOVASCULAR: Negative for chest pain, dizziness, palpitations and pedal edema.  ?RESPIRATORY: Negative for recent cough and dyspnea.  ?GASTROINTESTINAL: see HPI ?MSK: Negative for arthralgias and myalgias.  ?INTEGUMENTARY: Negative for rash.  ?NEUROLOGICAL: see HPI ?PSYCHIATRIC: Negative for sleep disturbance and to question depression screen.  Negative for depression, negative for anhedonia.  ?   ? ? ?Objective:  ?PHYSICAL EXAM:  ? ?VS: BP 124/80   Pulse (!) 102   Temp 98.4 ?F (36.9 ?C)   Resp 15   Ht 5\' 2"  (1.575 m)   Wt 125 lb (56.7 kg)   SpO2 98%   BMI 22.86 kg/m?  ? ?GEN: Well nourished, well developed, in no acute distress  ?Cardiac: RRR; no murmurs,  ?Respiratory:  normal respiratory rate and pattern with no distress - normal breath sounds with no rales, rhonchi, wheezes or rubs ?MS: no deformity or atrophy  ?Skin: warm and dry, no rash  ?Neuro:  Alert and Oriented x 3, Strength and sensation are intact - CN II-Xii grossly intact ?Psych: euthymic mood, appropriate affect and demeanor ? ?Diabetic Foot Exam - Simple   ?No data filed ?  ?  ? ?Lab Results  ?Component Value Date  ? WBC 7.8 12/31/2020  ? HGB 14.7 12/31/2020  ? HCT 42.9 12/31/2020  ? PLT 318 12/31/2020  ? GLUCOSE 131 (H) 12/31/2020  ? CHOL 204  (H) 12/31/2020  ? TRIG 138 12/31/2020  ? HDL 54 12/31/2020  ? LDLCALC 126 (H) 12/31/2020  ? ALT 16 12/31/2020  ? AST 27 12/31/2020  ? NA 143 12/31/2020  ? K 4.1 12/31/2020  ? CL 102 12/31/2020  ? CREATININE 0.84 12/31/2020  ? BUN 20 12/31/2020  ? CO2 21 12/31/2020  ? TSH 1.440 12/31/2020  ? INR 1.1 05/26/2020  ? HGBA1C 5.7 (H) 12/31/2020  ? ? ? ? ?Assessment & Plan:  ? ?Problem List Items Addressed This Visit   ? ?  ? Digestive  ? Gastroesophageal  reflux disease without esophagitis  ?  ? Other  ? Mixed hyperlipidemia - Primary (Chronic)  ?  Continue meds ?labwork pending ? ?  ?  ? Relevant Orders  ? CBC with Differential/Platelet  ? Comprehensive metabolic panel  ? Lipid panel  ? Moderate recurrent major depression (HCC)  ?  Continue current meds as directed ? ?  ?  ? Relevant Orders  ? TSH  ? Need for zoster vaccination  ?  Shingrix #1 given ? ?  ?  ? Relevant Orders  ? Varicella-zoster vaccine IM (Shingrix) (Completed)  ? Screen for colon cancer  ?  Referral made to Dr Charm Barges ? ?  ?  ? Relevant Orders  ? Ambulatory referral to Gastroenterology  ? Chronic headache with new features  ?  CT head ordered ? ?  ?  ? Relevant Orders  ? CBC with Differential/Platelet  ? Comprehensive metabolic panel  ? TSH  ? CT HEAD WO CONTRAST ( )  ? RESOLVED: Anxiety  ? ?Other Visit Diagnoses   ? ? Elevated glucose      ? Relevant Orders  ? Hemoglobin A1c  ? ?  ?. ? ?No orders of the defined types were placed in this encounter. ? ? ?Orders Placed This Encounter  ?Procedures  ? CT HEAD WO CONTRAST ( )  ? Varicella-zoster vaccine IM (Shingrix)  ? CBC with Differential/Platelet  ? Comprehensive metabolic panel  ? TSH  ? Lipid panel  ? Hemoglobin A1c  ? Ambulatory referral to Gastroenterology  ?  ? ?Follow-up: Return in about 3 months (around 10/15/2021) for fasting follow up. ? ?An After Visit Summary was printed and given to the patient. ? ?SARA R Meliza Kage, PA-C ?[facility] ?[phone]

## 2021-07-15 NOTE — Assessment & Plan Note (Signed)
Continue current meds as directed 

## 2021-07-16 LAB — HEMOGLOBIN A1C
Est. average glucose Bld gHb Est-mCnc: 120 mg/dL
Hgb A1c MFr Bld: 5.8 % — ABNORMAL HIGH (ref 4.8–5.6)

## 2021-07-16 LAB — CBC WITH DIFFERENTIAL/PLATELET
Basophils Absolute: 0.1 10*3/uL (ref 0.0–0.2)
Basos: 1 %
EOS (ABSOLUTE): 0.5 10*3/uL — ABNORMAL HIGH (ref 0.0–0.4)
Eos: 7 %
Hematocrit: 39.7 % (ref 34.0–46.6)
Hemoglobin: 13.5 g/dL (ref 11.1–15.9)
Immature Grans (Abs): 0 10*3/uL (ref 0.0–0.1)
Immature Granulocytes: 0 %
Lymphocytes Absolute: 1.5 10*3/uL (ref 0.7–3.1)
Lymphs: 19 %
MCH: 32.3 pg (ref 26.6–33.0)
MCHC: 34 g/dL (ref 31.5–35.7)
MCV: 95 fL (ref 79–97)
Monocytes Absolute: 0.5 10*3/uL (ref 0.1–0.9)
Monocytes: 6 %
Neutrophils Absolute: 5.4 10*3/uL (ref 1.4–7.0)
Neutrophils: 67 %
Platelets: 299 10*3/uL (ref 150–450)
RBC: 4.18 x10E6/uL (ref 3.77–5.28)
RDW: 12.5 % (ref 11.7–15.4)
WBC: 8 10*3/uL (ref 3.4–10.8)

## 2021-07-16 LAB — LIPID PANEL
Chol/HDL Ratio: 3.3 ratio (ref 0.0–4.4)
Cholesterol, Total: 183 mg/dL (ref 100–199)
HDL: 56 mg/dL (ref >39)
LDL Chol Calc (NIH): 104 mg/dL — ABNORMAL HIGH (ref 0–99)
Triglycerides: 131 mg/dL (ref 0–149)
VLDL Cholesterol Cal: 23 mg/dL (ref 5–40)

## 2021-07-16 LAB — COMPREHENSIVE METABOLIC PANEL
ALT: 12 IU/L (ref 0–32)
AST: 20 IU/L (ref 0–40)
Albumin/Globulin Ratio: 1.7 (ref 1.2–2.2)
Albumin: 4.3 g/dL (ref 3.8–4.8)
Alkaline Phosphatase: 102 IU/L (ref 44–121)
BUN/Creatinine Ratio: 33 — ABNORMAL HIGH (ref 12–28)
BUN: 25 mg/dL (ref 8–27)
Bilirubin Total: 0.4 mg/dL (ref 0.0–1.2)
CO2: 22 mmol/L (ref 20–29)
Calcium: 9.3 mg/dL (ref 8.7–10.3)
Chloride: 103 mmol/L (ref 96–106)
Creatinine, Ser: 0.76 mg/dL (ref 0.57–1.00)
Globulin, Total: 2.5 g/dL (ref 1.5–4.5)
Glucose: 89 mg/dL (ref 70–99)
Potassium: 3.9 mmol/L (ref 3.5–5.2)
Sodium: 139 mmol/L (ref 134–144)
Total Protein: 6.8 g/dL (ref 6.0–8.5)
eGFR: 89 mL/min/{1.73_m2} (ref >59)

## 2021-07-16 LAB — TSH: TSH: 1.37 u[IU]/mL (ref 0.450–4.500)

## 2021-07-16 LAB — CARDIOVASCULAR RISK ASSESSMENT

## 2021-07-18 ENCOUNTER — Telehealth: Payer: Self-pay | Admitting: Physician Assistant

## 2021-07-18 NOTE — Telephone Encounter (Signed)
? ?  Kimberly David has been scheduled for the following appointment: ? ?WHAT: HEAD CT ?WHERE: [place] ?DATE: 07/21/21 ?TIME: 12:30 PM CHECK IN ? ?A message has been left for the patient. ? ?

## 2021-08-07 DIAGNOSIS — R109 Unspecified abdominal pain: Secondary | ICD-10-CM | POA: Diagnosis not present

## 2021-08-07 DIAGNOSIS — N1330 Unspecified hydronephrosis: Secondary | ICD-10-CM | POA: Diagnosis not present

## 2021-08-07 DIAGNOSIS — N049 Nephrotic syndrome with unspecified morphologic changes: Secondary | ICD-10-CM | POA: Diagnosis not present

## 2021-08-07 DIAGNOSIS — N201 Calculus of ureter: Secondary | ICD-10-CM | POA: Diagnosis not present

## 2021-08-07 DIAGNOSIS — K5730 Diverticulosis of large intestine without perforation or abscess without bleeding: Secondary | ICD-10-CM | POA: Diagnosis not present

## 2021-08-07 DIAGNOSIS — K449 Diaphragmatic hernia without obstruction or gangrene: Secondary | ICD-10-CM | POA: Diagnosis not present

## 2021-08-26 ENCOUNTER — Other Ambulatory Visit: Payer: Self-pay | Admitting: Physician Assistant

## 2021-08-29 ENCOUNTER — Other Ambulatory Visit: Payer: Self-pay | Admitting: Physician Assistant

## 2021-08-29 ENCOUNTER — Other Ambulatory Visit: Payer: Self-pay | Admitting: Nurse Practitioner

## 2021-08-29 DIAGNOSIS — K219 Gastro-esophageal reflux disease without esophagitis: Secondary | ICD-10-CM

## 2021-08-29 DIAGNOSIS — F419 Anxiety disorder, unspecified: Secondary | ICD-10-CM

## 2021-09-25 DIAGNOSIS — N3001 Acute cystitis with hematuria: Secondary | ICD-10-CM | POA: Diagnosis not present

## 2021-09-25 DIAGNOSIS — N10 Acute tubulo-interstitial nephritis: Secondary | ICD-10-CM | POA: Diagnosis not present

## 2021-09-25 DIAGNOSIS — R112 Nausea with vomiting, unspecified: Secondary | ICD-10-CM | POA: Diagnosis not present

## 2021-09-25 DIAGNOSIS — Z87442 Personal history of urinary calculi: Secondary | ICD-10-CM | POA: Diagnosis not present

## 2021-09-25 DIAGNOSIS — N12 Tubulo-interstitial nephritis, not specified as acute or chronic: Secondary | ICD-10-CM | POA: Diagnosis not present

## 2021-09-25 DIAGNOSIS — N200 Calculus of kidney: Secondary | ICD-10-CM | POA: Diagnosis not present

## 2021-09-25 DIAGNOSIS — N201 Calculus of ureter: Secondary | ICD-10-CM | POA: Diagnosis not present

## 2021-09-25 DIAGNOSIS — N134 Hydroureter: Secondary | ICD-10-CM | POA: Diagnosis not present

## 2021-09-25 DIAGNOSIS — R109 Unspecified abdominal pain: Secondary | ICD-10-CM | POA: Diagnosis not present

## 2021-09-25 DIAGNOSIS — N202 Calculus of kidney with calculus of ureter: Secondary | ICD-10-CM | POA: Diagnosis not present

## 2021-09-25 DIAGNOSIS — D72829 Elevated white blood cell count, unspecified: Secondary | ICD-10-CM | POA: Diagnosis not present

## 2021-09-25 DIAGNOSIS — R300 Dysuria: Secondary | ICD-10-CM | POA: Diagnosis not present

## 2021-09-26 DIAGNOSIS — N201 Calculus of ureter: Secondary | ICD-10-CM | POA: Diagnosis not present

## 2021-09-26 DIAGNOSIS — R1031 Right lower quadrant pain: Secondary | ICD-10-CM | POA: Diagnosis not present

## 2021-09-29 DIAGNOSIS — N135 Crossing vessel and stricture of ureter without hydronephrosis: Secondary | ICD-10-CM | POA: Diagnosis not present

## 2021-09-29 DIAGNOSIS — N201 Calculus of ureter: Secondary | ICD-10-CM | POA: Diagnosis not present

## 2021-10-12 DIAGNOSIS — N201 Calculus of ureter: Secondary | ICD-10-CM | POA: Diagnosis not present

## 2021-10-12 DIAGNOSIS — R1031 Right lower quadrant pain: Secondary | ICD-10-CM | POA: Diagnosis not present

## 2021-10-13 DIAGNOSIS — Z466 Encounter for fitting and adjustment of urinary device: Secondary | ICD-10-CM | POA: Diagnosis not present

## 2021-10-13 DIAGNOSIS — N201 Calculus of ureter: Secondary | ICD-10-CM | POA: Diagnosis not present

## 2021-10-13 DIAGNOSIS — Z87442 Personal history of urinary calculi: Secondary | ICD-10-CM | POA: Diagnosis not present

## 2021-10-21 ENCOUNTER — Ambulatory Visit: Payer: BC Managed Care – PPO | Admitting: Physician Assistant

## 2021-11-03 ENCOUNTER — Telehealth: Payer: Self-pay

## 2021-11-03 NOTE — Telephone Encounter (Signed)
Patient calling as she recently had stent placed by Dr Saddie Benders. She has been scheduled a follow up appointment for evaluation of stent but has been cancelled and rescheduled three times. New appointment is tomorrow at 2:40. Patient has been having discomfort/pain when sitting and has been having stingy blood when urinating. Patient is very upset appointments have been canceled multiple times.   Patient advised to continue with appointment tomorrow. After speaking with PCP, PCP recommends she sees Dr Saddie Benders tomorrow or proceed to hospital.   Patient plans to see Dr Saddie Benders tomorrow but if appointment is cancelled will proceed to hospital for evaluation.   Lorita Officer, CCMA 11/03/21 3:10 PM

## 2021-11-04 DIAGNOSIS — N201 Calculus of ureter: Secondary | ICD-10-CM | POA: Diagnosis not present

## 2021-11-04 DIAGNOSIS — N200 Calculus of kidney: Secondary | ICD-10-CM | POA: Diagnosis not present

## 2021-11-04 DIAGNOSIS — R1031 Right lower quadrant pain: Secondary | ICD-10-CM | POA: Diagnosis not present

## 2021-11-04 DIAGNOSIS — Z466 Encounter for fitting and adjustment of urinary device: Secondary | ICD-10-CM | POA: Diagnosis not present

## 2021-11-08 DIAGNOSIS — R1031 Right lower quadrant pain: Secondary | ICD-10-CM | POA: Diagnosis not present

## 2021-11-08 DIAGNOSIS — N201 Calculus of ureter: Secondary | ICD-10-CM | POA: Diagnosis not present

## 2021-11-11 ENCOUNTER — Telehealth: Payer: Self-pay

## 2021-11-11 NOTE — Patient Outreach (Signed)
  Care Coordination   11/11/2021 Name: Kimberly David MRN: [id number] DOB: [birth year]   Care Coordination Outreach Attempts:  An unsuccessful telephone outreach was attempted today to offer the patient information about available care coordination services as a benefit of their health plan.   Follow Up Plan:  Additional outreach attempts will be made to offer the patient care coordination information and services.   Encounter Outcome:  No Answer  Care Coordination Interventions Activated:  No   Care Coordination Interventions:  No, not indicated    Rowe Pavy, RN, BSN, CEN [facility] [employer] [phone]

## 2021-11-29 ENCOUNTER — Other Ambulatory Visit: Payer: Self-pay | Admitting: Physician Assistant

## 2021-11-29 DIAGNOSIS — F321 Major depressive disorder, single episode, moderate: Secondary | ICD-10-CM

## 2021-12-01 ENCOUNTER — Other Ambulatory Visit: Payer: Self-pay | Admitting: Family Medicine

## 2021-12-02 ENCOUNTER — Other Ambulatory Visit: Payer: Self-pay | Admitting: Physician Assistant

## 2021-12-02 DIAGNOSIS — F419 Anxiety disorder, unspecified: Secondary | ICD-10-CM

## 2021-12-05 ENCOUNTER — Telehealth: Payer: Self-pay

## 2021-12-05 NOTE — Patient Outreach (Signed)
  Care Coordination   12/05/2021 Name: Kimberly David MRN: [id number] DOB: 18-Feb-1961   Care Coordination Outreach Attempts:  A second unsuccessful outreach was attempted today to offer the patient with information about available care coordination services as a benefit of their health plan.     Follow Up Plan:  Additional outreach attempts will be made to offer the patient care coordination information and services.   Encounter Outcome:  No Answer  Care Coordination Interventions Activated:  No   Care Coordination Interventions:  No, not indicated    Tomasa Rand, RN, BSN, CEN [facility] [employer] [phone]

## 2021-12-06 ENCOUNTER — Ambulatory Visit: Payer: BC Managed Care – PPO | Admitting: Physician Assistant

## 2021-12-13 ENCOUNTER — Telehealth: Payer: Self-pay

## 2021-12-13 NOTE — Patient Outreach (Signed)
  Care Coordination   12/13/2021 Name: Knox Holdman MRN: [id number] DOB: 1960-12-24   Care Coordination Outreach Attempts:  A third unsuccessful outreach was attempted today to offer the patient with information about available care coordination services as a benefit of their health plan.   Follow Up Plan:  No further outreach attempts will be made at this time. We have been unable to contact the patient to offer or enroll patient in care coordination services  Encounter Outcome:  No Answer  Care Coordination Interventions Activated:  No   Care Coordination Interventions:  No, not indicated    Tomasa Rand, RN, BSN, CEN [facility] Coordinator [phone]

## 2021-12-22 ENCOUNTER — Ambulatory Visit: Payer: BC Managed Care – PPO | Admitting: Physician Assistant

## 2022-01-01 ENCOUNTER — Other Ambulatory Visit: Payer: Self-pay | Admitting: Physician Assistant

## 2022-01-12 ENCOUNTER — Other Ambulatory Visit: Payer: Self-pay | Admitting: Physician Assistant

## 2022-01-12 ENCOUNTER — Other Ambulatory Visit: Payer: Self-pay | Admitting: Nurse Practitioner

## 2022-01-12 DIAGNOSIS — K219 Gastro-esophageal reflux disease without esophagitis: Secondary | ICD-10-CM

## 2022-01-12 DIAGNOSIS — F321 Major depressive disorder, single episode, moderate: Secondary | ICD-10-CM

## 2022-02-14 ENCOUNTER — Ambulatory Visit (INDEPENDENT_AMBULATORY_CARE_PROVIDER_SITE_OTHER): Payer: BC Managed Care – PPO | Admitting: Physician Assistant

## 2022-02-14 ENCOUNTER — Encounter: Payer: Self-pay | Admitting: Physician Assistant

## 2022-02-14 VITALS — BP 120/72 | HR 99 | Temp 97.3°F | Ht 62.0 in | Wt 126.0 lb

## 2022-02-14 DIAGNOSIS — F331 Major depressive disorder, recurrent, moderate: Secondary | ICD-10-CM

## 2022-02-14 DIAGNOSIS — Z23 Encounter for immunization: Secondary | ICD-10-CM

## 2022-02-14 DIAGNOSIS — R7303 Prediabetes: Secondary | ICD-10-CM

## 2022-02-14 DIAGNOSIS — F411 Generalized anxiety disorder: Secondary | ICD-10-CM

## 2022-02-14 DIAGNOSIS — F419 Anxiety disorder, unspecified: Secondary | ICD-10-CM

## 2022-02-14 DIAGNOSIS — K219 Gastro-esophageal reflux disease without esophagitis: Secondary | ICD-10-CM

## 2022-02-14 DIAGNOSIS — E782 Mixed hyperlipidemia: Secondary | ICD-10-CM | POA: Diagnosis not present

## 2022-02-14 DIAGNOSIS — Z5320 Procedure and treatment not carried out because of patient's decision for unspecified reasons: Secondary | ICD-10-CM

## 2022-02-14 DIAGNOSIS — Z1231 Encounter for screening mammogram for malignant neoplasm of breast: Secondary | ICD-10-CM

## 2022-02-14 MED ORDER — ALPRAZOLAM 0.25 MG PO TABS: ORAL_TABLET | ORAL | 1 refills | Status: DC

## 2022-02-14 NOTE — Progress Notes (Signed)
Subjective:  Patient ID: Kimberly David, female    DOB: June 29, 1960  Age: 61 y.o. MRN: [id number]  Chief Complaint  Patient presents with   Hyperlipidemia    HPI  Pt presents for follow up of hypertension.  The patient is tolerating the medication well without side effects. Compliance with treatment has been good; including taking medication as directed , maintains a healthy diet and regular exercise regimen , and following up as directed. Currently taking lipitor 20mg  qd  Pt presents for follow up of hypertension. The patient is tolerating the medication well without side effects. Compliance with treatment has been good; including taking medication as directed , maintains a healthy diet and regular exercise regimen , and following up as directed. Currently taking toprol XL 50mg  qd  Pt with history of prediabetes - due for labwork - is trying to watch diet  Pt with history of GERD - stable on prilosec 20mg  qd  Pt with history of anxiety /depression.  States she has a lot of stress from her daughter - she is currently taking effexor XR and alprazolam as needed  Pt is due for second shingrix vaccine  Pt would like to schedule mammogram Current Outpatient Medications on File Prior to Visit  Medication Sig Dispense Refill   albuterol (VENTOLIN HFA) 108 (90 Base) MCG/ACT inhaler Inhale 1-2 puffs into the lungs every 4 (four) hours as needed for shortness of breath.     ALPRAZolam (XANAX) 0.25 MG tablet TAKE 1 TABLET BY MOUTH EVERY DAY AT BEDTIME AS NEEDED 30 tablet 0   aspirin EC 81 MG tablet Take 1 tablet (81 mg total) by mouth daily. Swallow whole. 90 tablet 3   atorvastatin (LIPITOR) 20 MG tablet TAKE 1 TABLET BY MOUTH EVERY DAY 90 tablet 0   FYAVOLV 1-5 MG-MCG TABS tablet Take 1 tablet by mouth daily.     meclizine (ANTIVERT) 12.5 MG tablet TAKE 2 TABLET EVERY 6 HOURS AS NEEDED FOR DIZZINESS. 60 tablet 0   meloxicam (MOBIC) 15 MG tablet TAKE 1 TABLET BY MOUTH EVERY DAY 30  tablet 0   metoprolol succinate (TOPROL-XL) 50 MG 24 hr tablet TAKE 1 TABLET BY MOUTH EVERYDAY AT BEDTIME 90 tablet 1   nitroGLYCERIN (NITROSTAT) 0.4 MG SL tablet Place 0.4 mg under the tongue every 5 (five) minutes as needed for chest pain.     omeprazole (PRILOSEC) 20 MG capsule TAKE 1 CAPSULE BY MOUTH EVERY DAY 90 capsule 1   venlafaxine XR (EFFEXOR-XR) 150 MG 24 hr capsule TAKE 1 CAPSULE BY MOUTH DAILY WITH BREAKFAST. 90 capsule 0   No current facility-administered medications on file prior to visit.   Past Medical History:  Diagnosis Date   Abnormal blood chemistry 10/27/2019   Angina pectoris (HCC) 01/30/2020   Anxiety    Cardiac murmur 05/09/2019   COVID-19    Depression    Depression, major, single episode, moderate (HCC) 05/06/2019   Easy bruising 10/27/2019   Eustachian tube disorder 12/23/2019   Ganglion cyst of wrist    Rt   History of severe acute respiratory syndrome coronavirus 2 (SARS-CoV-2) disease 03/02/2019   Hyperlipidemia    takes lipitor   Mixed hyperlipidemia 05/06/2019   Moderate recurrent major depression (HCC) 06/16/2019   Need for tetanus, diphtheria, and acellular pertussis (Tdap) vaccine 03/17/2020   Other fatigue 07/16/2019   Otitis media 12/23/2019   Palpitation 05/06/2019   Palpitations 05/09/2019   Past Surgical History:  Procedure Laterality Date   CESAREAN SECTION  02/08/2001  GANGLION CYST EXCISION  01/11/2012   Procedure: REMOVAL GANGLION OF WRIST;  Surgeon: Tami Ribas, MD;  Location: [facility];  Service: Orthopedics;  Laterality: Right;  right wrist excision mass     Family History  Problem Relation Age of Onset   Hyperlipidemia Mother    Heart attack Mother    Migraines Mother    Lung cancer Father    Diabetes type II Sister    Migraines Sister    Diabetes type I Daughter    Social History   Socioeconomic History   Marital status: Legally Separated    Spouse name: Not on file   Number of children: 3   Years of  education: Not on file   Highest education level: Not on file  Occupational History   Not on file  Tobacco Use   Smoking status: Never   Smokeless tobacco: Never  Vaping Use   Vaping Use: Never used  Substance and Sexual Activity   Alcohol use: No   Drug use: No   Sexual activity: Yes    Birth control/protection: Post-menopausal  Other Topics Concern   Not on file  Social History Narrative   Not on file   Social Determinants of Health   Financial Resource Strain: Not on file  Food Insecurity: Not on file  Transportation Needs: Not on file  Physical Activity: Not on file  Stress: Not on file  Social Connections: Not on file    Review of Systems  CONSTITUTIONAL: Negative for chills, fatigue, fever, unintentional weight gain and unintentional weight loss.  E/N/T: Negative for ear pain, nasal congestion and sore throat.  CARDIOVASCULAR: Negative for chest pain, dizziness, palpitations and pedal edema.  RESPIRATORY: Negative for recent cough and dyspnea.  GASTROINTESTINAL: Negative for abdominal pain, acid reflux symptoms, constipation, diarrhea, nausea and vomiting.  MSK: Negative for arthralgias and myalgias.  INTEGUMENTARY: Negative for rash.  NEUROLOGICAL: Negative for dizziness and headaches.  PSYCHIATRIC:see HPI     Objective:  PHYSICAL EXAM:   VS: BP 120/72 (BP Location: Left Arm, Patient Position: Sitting, Cuff Size: Normal)   Pulse 99   Temp (!) 97.3 F (36.3 C) (Temporal)   Ht 5\' 2"  (1.575 m)   Wt 126 lb (57.2 kg)   SpO2 99%   BMI 23.05 kg/m   GEN: Well nourished, well developed, in no acute distress  Cardiac: RRR; no murmurs, rubs, or gallops,no edema - Respiratory:  normal respiratory rate and pattern with no distress - normal breath sounds with no rales, rhonchi, wheezes or rubs MS: no deformity or atrophy  Skin: warm and dry, no rash  Neuro:  Alert and Oriented x 3,  CN II-Xii grossly intact Psych: euthymic mood, appropriate affect and  demeanor    02/14/2022    9:28 AM 07/15/2021    9:24 AM 10/05/2020    3:43 PM 03/17/2020    9:29 AM 06/16/2019    3:56 PM  Depression screen PHQ 2/9  Decreased Interest 0 0 0 3 0  Down, Depressed, Hopeless 1 0 0 3 1  PHQ - 2 Score 1 0 0 6 1  Altered sleeping 3 0 3 3 1   Tired, decreased energy 3 0 0 3 1  Change in appetite 0 0 0 3 1  Feeling bad or failure about yourself  0 0 0 3 0  Trouble concentrating 0 0 0 2 0  Moving slowly or fidgety/restless 0 0 0 0 0  Suicidal thoughts 0 0 0 1 0  PHQ-9 Score 7 0 3 21 4   Difficult doing work/chores Somewhat difficult  Not difficult at all Extremely dIfficult Somewhat difficult     Lab Results  Component Value Date   WBC 8.0 07/15/2021   HGB 13.5 07/15/2021   HCT 39.7 07/15/2021   PLT 299 07/15/2021   GLUCOSE 89 07/15/2021   CHOL 183 07/15/2021   TRIG 131 07/15/2021   HDL 56 07/15/2021   LDLCALC 104 (H) 07/15/2021   ALT 12 07/15/2021   AST 20 07/15/2021   NA 139 07/15/2021   K 3.9 07/15/2021   CL 103 07/15/2021   CREATININE 0.76 07/15/2021   BUN 25 07/15/2021   CO2 22 07/15/2021   TSH 1.370 07/15/2021   INR 1.1 05/26/2020   HGBA1C 5.8 (H) 07/15/2021      Assessment & Plan:   Problem List Items Addressed This Visit       Digestive   Gastroesophageal reflux disease without esophagitis Continue current meds     Other   Mixed hyperlipidemia - Primary (Chronic)   Relevant Orders   CBC with Differential/Platelet   Comprehensive metabolic panel   Lipid panel Continue med   Moderate recurrent major depression (HCC)   Relevant Orders   TSH Continue meds   Need for zoster vaccination   Relevant Orders   Zoster Recombinant (Shingrix )   Other Visit Diagnoses     Anxiety       Relevant Orders   TSH   Prediabetes       Relevant Orders   Hemoglobin A1c   Encounter for screening mammogram for malignant neoplasm of breast       Relevant Orders   MM DIGITAL SCREENING BILATERAL     .  No orders of the defined types  were placed in this encounter.   Orders Placed This Encounter  Procedures   MM DIGITAL SCREENING BILATERAL   Zoster Recombinant (Shingrix )   CBC with Differential/Platelet   Comprehensive metabolic panel   TSH   Lipid panel   Hemoglobin A1c     Follow-up: Return in about 6 months (around 08/16/2022) for chronic fasting follow up.  An After Visit Summary was printed and given to the patient.  10/16/2022 [facility] [phone]

## 2022-02-15 LAB — CBC WITH DIFFERENTIAL/PLATELET
Basophils Absolute: 0.1 10*3/uL (ref 0.0–0.2)
Basos: 2 %
EOS (ABSOLUTE): 0.3 10*3/uL (ref 0.0–0.4)
Eos: 5 %
Hematocrit: 38.9 % (ref 34.0–46.6)
Hemoglobin: 13.2 g/dL (ref 11.1–15.9)
Immature Grans (Abs): 0 10*3/uL (ref 0.0–0.1)
Immature Granulocytes: 0 %
Lymphocytes Absolute: 1.5 10*3/uL (ref 0.7–3.1)
Lymphs: 23 %
MCH: 31.4 pg (ref 26.6–33.0)
MCHC: 33.9 g/dL (ref 31.5–35.7)
MCV: 93 fL (ref 79–97)
Monocytes Absolute: 0.4 10*3/uL (ref 0.1–0.9)
Monocytes: 6 %
Neutrophils Absolute: 4.4 10*3/uL (ref 1.4–7.0)
Neutrophils: 64 %
Platelets: 304 10*3/uL (ref 150–450)
RBC: 4.2 x10E6/uL (ref 3.77–5.28)
RDW: 13.2 % (ref 11.7–15.4)
WBC: 6.8 10*3/uL (ref 3.4–10.8)

## 2022-02-15 LAB — LIPID PANEL
Chol/HDL Ratio: 3.6 ratio (ref 0.0–4.4)
Cholesterol, Total: 196 mg/dL (ref 100–199)
HDL: 54 mg/dL (ref >39)
LDL Chol Calc (NIH): 114 mg/dL — ABNORMAL HIGH (ref 0–99)
Triglycerides: 159 mg/dL — ABNORMAL HIGH (ref 0–149)
VLDL Cholesterol Cal: 28 mg/dL (ref 5–40)

## 2022-02-15 LAB — CARDIOVASCULAR RISK ASSESSMENT

## 2022-02-15 LAB — COMPREHENSIVE METABOLIC PANEL
ALT: 11 IU/L (ref 0–32)
AST: 17 IU/L (ref 0–40)
Albumin/Globulin Ratio: 2 (ref 1.2–2.2)
Albumin: 4.5 g/dL (ref 3.9–4.9)
Alkaline Phosphatase: 117 IU/L (ref 44–121)
BUN/Creatinine Ratio: 20 (ref 12–28)
BUN: 15 mg/dL (ref 8–27)
Bilirubin Total: 0.3 mg/dL (ref 0.0–1.2)
CO2: 23 mmol/L (ref 20–29)
Calcium: 9.5 mg/dL (ref 8.7–10.3)
Chloride: 104 mmol/L (ref 96–106)
Creatinine, Ser: 0.74 mg/dL (ref 0.57–1.00)
Globulin, Total: 2.3 g/dL (ref 1.5–4.5)
Glucose: 85 mg/dL (ref 70–99)
Potassium: 3.9 mmol/L (ref 3.5–5.2)
Sodium: 140 mmol/L (ref 134–144)
Total Protein: 6.8 g/dL (ref 6.0–8.5)
eGFR: 91 mL/min/{1.73_m2} (ref >59)

## 2022-02-15 LAB — TSH: TSH: 1.37 u[IU]/mL (ref 0.450–4.500)

## 2022-02-15 LAB — HEMOGLOBIN A1C
Est. average glucose Bld gHb Est-mCnc: 117 mg/dL
Hgb A1c MFr Bld: 5.7 % — ABNORMAL HIGH (ref 4.8–5.6)

## 2022-03-09 ENCOUNTER — Inpatient Hospital Stay: Admission: RE | Admit: 2022-03-09 | Payer: BC Managed Care – PPO | Source: Ambulatory Visit

## 2022-04-10 ENCOUNTER — Other Ambulatory Visit: Payer: Self-pay | Admitting: Family Medicine

## 2022-05-19 ENCOUNTER — Other Ambulatory Visit: Payer: Self-pay | Admitting: Family Medicine

## 2022-05-19 DIAGNOSIS — F321 Major depressive disorder, single episode, moderate: Secondary | ICD-10-CM

## 2022-06-16 ENCOUNTER — Encounter: Payer: Self-pay | Admitting: Physician Assistant

## 2022-07-16 ENCOUNTER — Other Ambulatory Visit: Payer: Self-pay | Admitting: Physician Assistant

## 2022-08-14 ENCOUNTER — Other Ambulatory Visit: Payer: Self-pay | Admitting: Family Medicine

## 2022-08-15 ENCOUNTER — Other Ambulatory Visit: Payer: Self-pay | Admitting: Physician Assistant

## 2022-08-15 DIAGNOSIS — F321 Major depressive disorder, single episode, moderate: Secondary | ICD-10-CM

## 2022-08-16 ENCOUNTER — Ambulatory Visit: Payer: BC Managed Care – PPO | Admitting: Physician Assistant

## 2022-08-21 ENCOUNTER — Other Ambulatory Visit: Payer: Self-pay | Admitting: Physician Assistant

## 2022-08-21 ENCOUNTER — Other Ambulatory Visit: Payer: Self-pay | Admitting: Family Medicine

## 2022-08-21 DIAGNOSIS — K219 Gastro-esophageal reflux disease without esophagitis: Secondary | ICD-10-CM

## 2022-08-21 DIAGNOSIS — F411 Generalized anxiety disorder: Secondary | ICD-10-CM

## 2022-08-21 DIAGNOSIS — F321 Major depressive disorder, single episode, moderate: Secondary | ICD-10-CM

## 2022-08-25 ENCOUNTER — Other Ambulatory Visit: Payer: Self-pay | Admitting: Physician Assistant

## 2022-08-25 ENCOUNTER — Other Ambulatory Visit: Payer: Self-pay | Admitting: Family Medicine

## 2022-08-25 DIAGNOSIS — K219 Gastro-esophageal reflux disease without esophagitis: Secondary | ICD-10-CM

## 2022-08-25 DIAGNOSIS — F411 Generalized anxiety disorder: Secondary | ICD-10-CM

## 2022-09-11 ENCOUNTER — Ambulatory Visit: Payer: BC Managed Care – PPO | Admitting: Physician Assistant

## 2022-09-11 ENCOUNTER — Encounter: Payer: Self-pay | Admitting: Physician Assistant

## 2022-09-13 ENCOUNTER — Other Ambulatory Visit: Payer: Self-pay | Admitting: Physician Assistant

## 2022-09-13 DIAGNOSIS — F321 Major depressive disorder, single episode, moderate: Secondary | ICD-10-CM

## 2022-09-28 ENCOUNTER — Other Ambulatory Visit: Payer: Self-pay | Admitting: Physician Assistant

## 2022-09-28 DIAGNOSIS — F321 Major depressive disorder, single episode, moderate: Secondary | ICD-10-CM

## 2022-09-28 DIAGNOSIS — F411 Generalized anxiety disorder: Secondary | ICD-10-CM

## 2022-10-07 ENCOUNTER — Other Ambulatory Visit: Payer: Self-pay | Admitting: Physician Assistant

## 2022-10-07 DIAGNOSIS — F411 Generalized anxiety disorder: Secondary | ICD-10-CM

## 2022-11-06 ENCOUNTER — Encounter: Payer: Self-pay | Admitting: Physician Assistant

## 2022-11-06 ENCOUNTER — Ambulatory Visit (INDEPENDENT_AMBULATORY_CARE_PROVIDER_SITE_OTHER): Payer: BC Managed Care – PPO | Admitting: Physician Assistant

## 2022-11-06 ENCOUNTER — Other Ambulatory Visit: Payer: Self-pay | Admitting: Physician Assistant

## 2022-11-06 VITALS — BP 130/80 | HR 100 | Temp 97.5°F | Ht 62.0 in | Wt 120.6 lb

## 2022-11-06 DIAGNOSIS — F321 Major depressive disorder, single episode, moderate: Secondary | ICD-10-CM

## 2022-11-06 DIAGNOSIS — F411 Generalized anxiety disorder: Secondary | ICD-10-CM

## 2022-11-06 DIAGNOSIS — R7303 Prediabetes: Secondary | ICD-10-CM

## 2022-11-06 DIAGNOSIS — K219 Gastro-esophageal reflux disease without esophagitis: Secondary | ICD-10-CM

## 2022-11-06 DIAGNOSIS — E782 Mixed hyperlipidemia: Secondary | ICD-10-CM | POA: Diagnosis not present

## 2022-11-06 DIAGNOSIS — F331 Major depressive disorder, recurrent, moderate: Secondary | ICD-10-CM | POA: Diagnosis not present

## 2022-11-06 DIAGNOSIS — F419 Anxiety disorder, unspecified: Secondary | ICD-10-CM | POA: Diagnosis not present

## 2022-11-06 MED ORDER — MECLIZINE HCL 12.5 MG PO TABS: 12.5000 mg | ORAL_TABLET | Freq: Two times a day (BID) | ORAL | 0 refills | Status: DC | PRN

## 2022-11-06 MED ORDER — ALPRAZOLAM 0.25 MG PO TABS: ORAL_TABLET | ORAL | 0 refills | Status: DC

## 2022-11-06 NOTE — Progress Notes (Signed)
Subjective:  Patient ID: Kimberly David, female    DOB: 1960/08/23  Age: 62 y.o. MRN: [id number]  Chief Complaint  Patient presents with   Medical Management of Chronic Issues    HPI  Pt presents for follow up of hyperlipidemia The patient is tolerating the medication well without side effects. Compliance with treatment has been good; including taking medication as directed , maintains a healthy diet and regular exercise regimen , and following up as directed. Currently taking lipitor 20mg  qd  Pt presents for follow up of hypertension. The patient is tolerating the medication well without side effects. Compliance with treatment has been good; including taking medication as directed , maintains a healthy diet and regular exercise regimen , and following up as directed. Currently taking toprol XL 50mg  qd  Pt with history of prediabetes - due for labwork - is trying to watch diet  Pt with history of GERD - stable on prilosec 20mg  qd  Pt with history of anxiety /depression.  States she has been stressed this month losing a grandchild (stillborn) and had COVID - she is currently taking effexor XR and alprazolam as needed  Current Outpatient Medications on File Prior to Visit  Medication Sig Dispense Refill   albuterol (VENTOLIN HFA) 108 (90 Base) MCG/ACT inhaler Inhale 1-2 puffs into the lungs every 4 (four) hours as needed for shortness of breath.     aspirin EC 81 MG tablet Take 1 tablet (81 mg total) by mouth daily. Swallow whole. 90 tablet 3   atorvastatin (LIPITOR) 20 MG tablet TAKE 1 TABLET BY MOUTH EVERY DAY 90 tablet 0   FYAVOLV 1-5 MG-MCG TABS tablet Take 1 tablet by mouth daily.     meloxicam (MOBIC) 15 MG tablet TAKE 1 TABLET BY MOUTH EVERY DAY 90 tablet 0   metoprolol succinate (TOPROL-XL) 50 MG 24 hr tablet TAKE 1 TABLET BY MOUTH EVERYDAY AT BEDTIME 90 tablet 0   nitroGLYCERIN (NITROSTAT) 0.4 MG SL tablet Place 0.4 mg under the tongue every 5 (five) minutes as needed  for chest pain.     omeprazole (PRILOSEC) 20 MG capsule TAKE 1 CAPSULE BY MOUTH EVERY DAY 90 capsule 0   venlafaxine XR (EFFEXOR-XR) 150 MG 24 hr capsule TAKE 1 CAPSULE BY MOUTH DAILY WITH BREAKFAST. 30 capsule 0   No current facility-administered medications on file prior to visit.   Past Medical History:  Diagnosis Date   Abnormal blood chemistry 10/27/2019   Angina pectoris (HCC) 01/30/2020   Anxiety    Cardiac murmur 05/09/2019   COVID-19    Depression    Depression, major, single episode, moderate (HCC) 05/06/2019   Easy bruising 10/27/2019   Eustachian tube disorder 12/23/2019   Ganglion cyst of wrist    Rt   History of severe acute respiratory syndrome coronavirus 2 (SARS-CoV-2) disease 03/02/2019   Hyperlipidemia    takes lipitor   Mixed hyperlipidemia 05/06/2019   Moderate recurrent major depression (HCC) 06/16/2019   Need for tetanus, diphtheria, and acellular pertussis (Tdap) vaccine 03/17/2020   Other fatigue 07/16/2019   Otitis media 12/23/2019   Palpitation 05/06/2019   Palpitations 05/09/2019   Past Surgical History:  Procedure Laterality Date   CESAREAN SECTION  02/08/2001   GANGLION CYST EXCISION  01/11/2012   Procedure: REMOVAL GANGLION OF WRIST;  Surgeon: Tami Ribas, MD;  Location: [facility];  Service: Orthopedics;  Laterality: Right;  right wrist excision mass     Family History  Problem Relation Age of  Onset   Hyperlipidemia Mother    Heart attack Mother    Migraines Mother    Lung cancer Father    Diabetes type II Sister    Migraines Sister    Diabetes type I Daughter    Social History   Socioeconomic History   Marital status: Legally Separated    Spouse name: Not on file   Number of children: 3   Years of education: Not on file   Highest education level: Not on file  Occupational History   Not on file  Tobacco Use   Smoking status: Never   Smokeless tobacco: Never  Vaping Use   Vaping status: Never Used  Substance and Sexual  Activity   Alcohol use: No   Drug use: No   Sexual activity: Yes    Birth control/protection: Post-menopausal  Other Topics Concern   Not on file  Social History Narrative   Not on file   Social Determinants of Health   Financial Resource Strain: Low Risk  (11/06/2022)   Overall Financial Resource Strain (CARDIA)    Difficulty of Paying Living Expenses: Not hard at all  Food Insecurity: No Food Insecurity (11/06/2022)   Hunger Vital Sign    Worried About Running Out of Food in the Last Year: Never true    Ran Out of Food in the Last Year: Never true  Transportation Needs: No Transportation Needs (11/06/2022)   PRAPARE - Administrator, Civil Service (Medical): No    Lack of Transportation (Non-Medical): No  Physical Activity: Sufficiently Active (11/06/2022)   Exercise Vital Sign    Days of Exercise per Week: 7 days    Minutes of Exercise per Session: 30 min  Stress: No Stress Concern Present (11/06/2022)   [employer] of Occupational Health - Occupational Stress Questionnaire    Feeling of Stress : Not at all  Social Connections: Moderately Isolated (11/06/2022)   Social Connection and Isolation Panel [NHANES]    Frequency of Communication with Friends and Family: More than three times a week    Frequency of Social Gatherings with Friends and Family: Three times a week    Attends Religious Services: Never    Active Member of Clubs or Organizations: No    Attends Banker Meetings: Never    Marital Status: Married   CONSTITUTIONAL: Negative for chills, fatigue, fever, unintentional weight gain and unintentional weight loss.  E/N/T: Negative for ear pain, nasal congestion and sore throat.  CARDIOVASCULAR: Negative for chest pain, dizziness, palpitations and pedal edema.  RESPIRATORY: Negative for recent cough and dyspnea.  GASTROINTESTINAL: Negative for abdominal pain, acid reflux symptoms, constipation, diarrhea, nausea and vomiting.  MSK: Negative  for arthralgias and myalgias.  INTEGUMENTARY: Negative for rash.  NEUROLOGICAL: Negative for dizziness and headaches.  PSYCHIATRIC: see HPI      Objective:  PHYSICAL EXAM:   VS: BP 130/80 (BP Location: Left Arm, Patient Position: Sitting, Cuff Size: Normal)   Pulse 100   Temp (!) 97.5 F (36.4 C) (Temporal)   Ht 5\' 2"  (1.575 m)   Wt 120 lb 9.6 oz (54.7 kg)   SpO2 97%   BMI 22.06 kg/m   GEN: Well nourished, well developed, in no acute distress  Cardiac: RRR; no murmurs, rubs, or gallops,no edema - Respiratory:  normal respiratory rate and pattern with no distress - normal breath sounds with no rales, rhonchi, wheezes or rubs Skin: warm and dry, no rash  Neuro:  Alert and Oriented x 3,  -  CN II-Xii grossly intact Psych: euthymic mood, appropriate affect and demeanor     11/06/2022    9:55 AM 02/14/2022    9:28 AM 07/15/2021    9:24 AM 10/05/2020    3:43 PM 03/17/2020    9:29 AM  Depression screen PHQ 2/9  Decreased Interest 0 0 0 0 3  Down, Depressed, Hopeless 0 1 0 0 3  PHQ - 2 Score 0 1 0 0 6  Altered sleeping 1 3 0 3 3  Tired, decreased energy 0 3 0 0 3  Change in appetite 0 0 0 0 3  Feeling bad or failure about yourself  0 0 0 0 3  Trouble concentrating 0 0 0 0 2  Moving slowly or fidgety/restless 0 0 0 0 0  Suicidal thoughts 0 0 0 0 1  PHQ-9 Score 1 7 0 3 21  Difficult doing work/chores Not difficult at all Somewhat difficult  Not difficult at all Extremely dIfficult     Lab Results  Component Value Date   WBC 6.8 02/14/2022   HGB 13.2 02/14/2022   HCT 38.9 02/14/2022   PLT 304 02/14/2022   GLUCOSE 85 02/14/2022   CHOL 196 02/14/2022   TRIG 159 (H) 02/14/2022   HDL 54 02/14/2022   LDLCALC 114 (H) 02/14/2022   ALT 11 02/14/2022   AST 17 02/14/2022   NA 140 02/14/2022   K 3.9 02/14/2022   CL 104 02/14/2022   CREATININE 0.74 02/14/2022   BUN 15 02/14/2022   CO2 23 02/14/2022   TSH 1.370 02/14/2022   INR 1.1 05/26/2020   HGBA1C 5.7 (H) 02/14/2022       Assessment & Plan:   Problem List Items Addressed This Visit       Digestive   Gastroesophageal reflux disease without esophagitis Continue current meds     Other   Mixed hyperlipidemia - Primary (Chronic)   Relevant Orders   CBC with Differential/Platelet   Comprehensive metabolic panel   Lipid panel Continue med   Moderate recurrent major depression (HCC)   Relevant Orders   TSH Continue meds     Anxiety       Relevant Orders   TSH Continue meds   Prediabetes       Relevant Orders   Hemoglobin A1c              .  Meds ordered this encounter  Medications   ALPRAZolam (XANAX) 0.25 MG tablet    Sig: TAKE 1 TABLET BY MOUTH EVERY DAY AT BEDTIME AS NEEDED    Dispense:  30 tablet    Refill:  0    This request is for a new prescription for a controlled substance as required by Federal/State law.    Order Specific Question:   Supervising Provider    Answer:   Corey Harold   meclizine (ANTIVERT) 12.5 MG tablet    Sig: Take 1 tablet (12.5 mg total) by mouth 2 (two) times daily as needed for dizziness.    Dispense:  60 tablet    Refill:  0    Order Specific Question:   Supervising Provider    AnswerCorey Harold    Orders Placed This Encounter  Procedures   CBC with Differential/Platelet   Comprehensive metabolic panel   TSH   Lipid panel   Hemoglobin A1c     Follow-up: Return in about 6 months (around 05/09/2023) for chronic fasting follow-up.  An After Visit Summary was  printed and given to the patient.  Jettie Pagan [facility] [phone]

## 2022-11-07 LAB — LIPID PANEL
Chol/HDL Ratio: 4 ratio (ref 0.0–4.4)
Cholesterol, Total: 159 mg/dL (ref 100–199)
HDL: 40 mg/dL (ref >39)
LDL Chol Calc (NIH): 91 mg/dL (ref 0–99)
Triglycerides: 158 mg/dL — ABNORMAL HIGH (ref 0–149)
VLDL Cholesterol Cal: 28 mg/dL (ref 5–40)

## 2022-11-07 LAB — COMPREHENSIVE METABOLIC PANEL
ALT: 18 IU/L (ref 0–32)
AST: 19 IU/L (ref 0–40)
Albumin: 4.2 g/dL (ref 3.9–4.9)
Alkaline Phosphatase: 110 IU/L (ref 44–121)
BUN/Creatinine Ratio: 20 (ref 12–28)
BUN: 15 mg/dL (ref 8–27)
Bilirubin Total: 0.3 mg/dL (ref 0.0–1.2)
CO2: 23 mmol/L (ref 20–29)
Calcium: 9.5 mg/dL (ref 8.7–10.3)
Chloride: 106 mmol/L (ref 96–106)
Creatinine, Ser: 0.75 mg/dL (ref 0.57–1.00)
Globulin, Total: 2.4 g/dL (ref 1.5–4.5)
Glucose: 89 mg/dL (ref 70–99)
Potassium: 4.3 mmol/L (ref 3.5–5.2)
Sodium: 143 mmol/L (ref 134–144)
Total Protein: 6.6 g/dL (ref 6.0–8.5)
eGFR: 90 mL/min/{1.73_m2} (ref >59)

## 2022-11-07 LAB — CBC WITH DIFFERENTIAL/PLATELET
Basophils Absolute: 0.1 10*3/uL (ref 0.0–0.2)
Basos: 1 %
EOS (ABSOLUTE): 0.2 10*3/uL (ref 0.0–0.4)
Eos: 2 %
Hematocrit: 36.8 % (ref 34.0–46.6)
Hemoglobin: 12.3 g/dL (ref 11.1–15.9)
Immature Grans (Abs): 0 10*3/uL (ref 0.0–0.1)
Immature Granulocytes: 0 %
Lymphocytes Absolute: 1.4 10*3/uL (ref 0.7–3.1)
Lymphs: 15 %
MCH: 31.4 pg (ref 26.6–33.0)
MCHC: 33.4 g/dL (ref 31.5–35.7)
MCV: 94 fL (ref 79–97)
Monocytes Absolute: 0.5 10*3/uL (ref 0.1–0.9)
Monocytes: 6 %
Neutrophils Absolute: 7.3 10*3/uL — ABNORMAL HIGH (ref 1.4–7.0)
Neutrophils: 76 %
Platelets: 364 10*3/uL (ref 150–450)
RBC: 3.92 x10E6/uL (ref 3.77–5.28)
RDW: 12.9 % (ref 11.7–15.4)
WBC: 9.5 10*3/uL (ref 3.4–10.8)

## 2022-11-07 LAB — TSH: TSH: 1.71 u[IU]/mL (ref 0.450–4.500)

## 2022-11-07 LAB — HEMOGLOBIN A1C
Est. average glucose Bld gHb Est-mCnc: 120 mg/dL
Hgb A1c MFr Bld: 5.8 % — ABNORMAL HIGH (ref 4.8–5.6)

## 2022-11-26 ENCOUNTER — Other Ambulatory Visit: Payer: Self-pay | Admitting: Physician Assistant

## 2022-12-04 DIAGNOSIS — Z1231 Encounter for screening mammogram for malignant neoplasm of breast: Secondary | ICD-10-CM | POA: Diagnosis not present

## 2022-12-04 DIAGNOSIS — Z01419 Encounter for gynecological examination (general) (routine) without abnormal findings: Secondary | ICD-10-CM | POA: Diagnosis not present

## 2022-12-04 DIAGNOSIS — Z6822 Body mass index (BMI) 22.0-22.9, adult: Secondary | ICD-10-CM | POA: Diagnosis not present

## 2022-12-04 DIAGNOSIS — Z124 Encounter for screening for malignant neoplasm of cervix: Secondary | ICD-10-CM | POA: Diagnosis not present

## 2022-12-06 ENCOUNTER — Other Ambulatory Visit: Payer: Self-pay | Admitting: Obstetrics and Gynecology

## 2022-12-06 DIAGNOSIS — R928 Other abnormal and inconclusive findings on diagnostic imaging of breast: Secondary | ICD-10-CM

## 2022-12-21 ENCOUNTER — Inpatient Hospital Stay: Admission: RE | Admit: 2022-12-21 | Payer: BC Managed Care – PPO | Source: Ambulatory Visit

## 2022-12-30 ENCOUNTER — Other Ambulatory Visit: Payer: Self-pay | Admitting: Physician Assistant

## 2022-12-30 DIAGNOSIS — K219 Gastro-esophageal reflux disease without esophagitis: Secondary | ICD-10-CM

## 2023-01-28 ENCOUNTER — Other Ambulatory Visit: Payer: Self-pay | Admitting: Physician Assistant

## 2023-01-28 DIAGNOSIS — F411 Generalized anxiety disorder: Secondary | ICD-10-CM

## 2023-03-19 DIAGNOSIS — R87612 Low grade squamous intraepithelial lesion on cytologic smear of cervix (LGSIL): Secondary | ICD-10-CM | POA: Diagnosis not present

## 2023-03-21 ENCOUNTER — Ambulatory Visit
Admission: RE | Admit: 2023-03-21 | Discharge: 2023-03-21 | Disposition: A | Discharge status: Home / Self Care | Payer: BC Managed Care – PPO | Source: Ambulatory Visit | Attending: Obstetrics and Gynecology | Admitting: Obstetrics and Gynecology

## 2023-03-21 ENCOUNTER — Other Ambulatory Visit: Payer: Self-pay | Admitting: Obstetrics and Gynecology

## 2023-03-21 DIAGNOSIS — N6325 Unspecified lump in the left breast, overlapping quadrants: Secondary | ICD-10-CM | POA: Diagnosis not present

## 2023-03-21 DIAGNOSIS — R928 Other abnormal and inconclusive findings on diagnostic imaging of breast: Secondary | ICD-10-CM

## 2023-03-21 LAB — HM MAMMOGRAPHY: HM Mammogram: NORMAL (ref 0–4)

## 2023-03-22 ENCOUNTER — Other Ambulatory Visit: Payer: Self-pay | Admitting: Obstetrics and Gynecology

## 2023-03-22 DIAGNOSIS — N6320 Unspecified lump in the left breast, unspecified quadrant: Secondary | ICD-10-CM

## 2023-03-29 ENCOUNTER — Other Ambulatory Visit: Payer: BC Managed Care – PPO

## 2023-04-29 ENCOUNTER — Other Ambulatory Visit: Payer: Self-pay | Admitting: Physician Assistant

## 2023-05-05 ENCOUNTER — Other Ambulatory Visit: Payer: Self-pay | Admitting: Physician Assistant

## 2023-05-05 DIAGNOSIS — F321 Major depressive disorder, single episode, moderate: Secondary | ICD-10-CM

## 2023-05-09 ENCOUNTER — Encounter: Payer: Self-pay | Admitting: Physician Assistant

## 2023-05-09 ENCOUNTER — Ambulatory Visit (INDEPENDENT_AMBULATORY_CARE_PROVIDER_SITE_OTHER): Payer: BC Managed Care – PPO | Admitting: Physician Assistant

## 2023-05-09 VITALS — BP 120/84 | HR 89 | Temp 97.7°F | Resp 18 | Ht 62.0 in | Wt 124.0 lb

## 2023-05-09 DIAGNOSIS — F331 Major depressive disorder, recurrent, moderate: Secondary | ICD-10-CM

## 2023-05-09 DIAGNOSIS — K219 Gastro-esophageal reflux disease without esophagitis: Secondary | ICD-10-CM | POA: Diagnosis not present

## 2023-05-09 DIAGNOSIS — Z1211 Encounter for screening for malignant neoplasm of colon: Secondary | ICD-10-CM

## 2023-05-09 DIAGNOSIS — E782 Mixed hyperlipidemia: Secondary | ICD-10-CM | POA: Diagnosis not present

## 2023-05-09 DIAGNOSIS — R7303 Prediabetes: Secondary | ICD-10-CM

## 2023-05-09 DIAGNOSIS — R002 Palpitations: Secondary | ICD-10-CM

## 2023-05-09 DIAGNOSIS — F411 Generalized anxiety disorder: Secondary | ICD-10-CM

## 2023-05-09 MED ORDER — ATORVASTATIN CALCIUM 20 MG PO TABS: 20.0000 mg | ORAL_TABLET | Freq: Every day | ORAL | 1 refills | Status: DC

## 2023-05-09 MED ORDER — METOPROLOL SUCCINATE ER 50 MG PO TB24: 50.0000 mg | ORAL_TABLET | Freq: Every day | ORAL | 1 refills | Status: DC

## 2023-05-09 MED ORDER — OMEPRAZOLE 20 MG PO CPDR: 20.0000 mg | DELAYED_RELEASE_CAPSULE | Freq: Every day | ORAL | 1 refills | Status: DC

## 2023-05-09 NOTE — Progress Notes (Signed)
 Subjective:  Patient ID: Kimberly David, female    DOB: 1960-09-01  Age: 63 y.o. MRN: [id number]  Chief Complaint  Patient presents with   Medical Management of Chronic Issues    HPI  Pt presents for follow up of hyperlipidemia The patient is tolerating the medication well without side effects. Compliance with treatment has been good; including taking medication as directed , maintains a healthy diet and regular exercise regimen , and following up as directed. Currently taking lipitor 20mg  qd and requests refill of med (she did run out over a week ago but otherwise says taking regularly)  Pt presents for follow up of palpitations The patient is tolerating the medication well without side effects. Compliance with treatment has been good; including taking medication as directed , maintains a healthy diet and regular exercise regimen , and following up as directed. Currently taking toprol XL 50mg  qd and requests refill of medication  Pt with history of prediabetes - due for labwork - is trying to watch diet - due to check hgb a1c  Pt with history of GERD - stable on prilosec 20mg  qd - requests refill of med  Pt with history of anxiety /depression.  States overall she is doing well  - she is currently taking effexor XR and alprazolam as needed  Pt agreeable to schedule screening colonoscopy  Current Outpatient Medications on File Prior to Visit  Medication Sig Dispense Refill   albuterol (VENTOLIN HFA) 108 (90 Base) MCG/ACT inhaler Inhale 1-2 puffs into the lungs every 4 (four) hours as needed for shortness of breath.     ALPRAZolam (XANAX) 0.25 MG tablet TAKE 1 TABLET BY MOUTH EVERY DAY AT BEDTIME AS NEEDED 30 tablet 0   aspirin EC 81 MG tablet Take 1 tablet (81 mg total) by mouth daily. Swallow whole. 90 tablet 3   FYAVOLV 1-5 MG-MCG TABS tablet Take 1 tablet by mouth daily.     meclizine (ANTIVERT) 12.5 MG tablet TAKE 1 TABLET (12.5 MG TOTAL) BY MOUTH 2 (TWO) TIMES DAILY AS  NEEDED FOR DIZZINESS. 60 tablet 0   meloxicam (MOBIC) 15 MG tablet TAKE 1 TABLET BY MOUTH EVERY DAY 90 tablet 0   nitroGLYCERIN (NITROSTAT) 0.4 MG SL tablet Place 0.4 mg under the tongue every 5 (five) minutes as needed for chest pain.     venlafaxine XR (EFFEXOR-XR) 150 MG 24 hr capsule TAKE 1 CAPSULE BY MOUTH DAILY WITH BREAKFAST. 30 capsule 0   No current facility-administered medications on file prior to visit.   Past Medical History:  Diagnosis Date   Abnormal blood chemistry 10/27/2019   Angina pectoris (HCC) 01/30/2020   Anxiety    Cardiac murmur 05/09/2019   COVID-19    Depression    Depression, major, single episode, moderate (HCC) 05/06/2019   Easy bruising 10/27/2019   Eustachian tube disorder 12/23/2019   Ganglion cyst of wrist    Rt   History of severe acute respiratory syndrome coronavirus 2 (SARS-CoV-2) disease 03/02/2019   Hyperlipidemia    takes lipitor   Mixed hyperlipidemia 05/06/2019   Moderate recurrent major depression (HCC) 06/16/2019   Need for tetanus, diphtheria, and acellular pertussis (Tdap) vaccine 03/17/2020   Other fatigue 07/16/2019   Otitis media 12/23/2019   Palpitation 05/06/2019   Palpitations 05/09/2019   Past Surgical History:  Procedure Laterality Date   CESAREAN SECTION  02/08/2001   GANGLION CYST EXCISION  01/11/2012   Procedure: REMOVAL GANGLION OF WRIST;  Surgeon: Tami Ribas, MD;  Location:  [facility];  Service: Orthopedics;  Laterality: Right;  right wrist excision mass     Family History  Problem Relation Age of Onset   Hyperlipidemia Mother    Heart attack Mother    Migraines Mother    Lung cancer Father    Diabetes type II Sister    Migraines Sister    Diabetes type I Daughter    Social History   Socioeconomic History   Marital status: Legally Separated    Spouse name: Not on file   Number of children: 3   Years of education: Not on file   Highest education level: Not on file  Occupational History   Not on  file  Tobacco Use   Smoking status: Never   Smokeless tobacco: Never  Vaping Use   Vaping status: Never Used  Substance and Sexual Activity   Alcohol use: No   Drug use: No   Sexual activity: Yes    Birth control/protection: Post-menopausal  Other Topics Concern   Not on file  Social History Narrative   Not on file   Social Drivers of Health   Financial Resource Strain: Low Risk  (11/06/2022)   Overall Financial Resource Strain (CARDIA)    Difficulty of Paying Living Expenses: Not hard at all  Food Insecurity: No Food Insecurity (11/06/2022)   Hunger Vital Sign    Worried About Running Out of Food in the Last Year: Never true    Ran Out of Food in the Last Year: Never true  Transportation Needs: No Transportation Needs (11/06/2022)   PRAPARE - Administrator, Civil Service (Medical): No    Lack of Transportation (Non-Medical): No  Physical Activity: Sufficiently Active (11/06/2022)   Exercise Vital Sign    Days of Exercise per Week: 7 days    Minutes of Exercise per Session: 30 min  Stress: No Stress Concern Present (11/06/2022)   [employer] of Occupational Health - Occupational Stress Questionnaire    Feeling of Stress : Not at all  Social Connections: Moderately Isolated (11/06/2022)   Social Connection and Isolation Panel [NHANES]    Frequency of Communication with Friends and Family: More than three times a week    Frequency of Social Gatherings with Friends and Family: Three times a week    Attends Religious Services: Never    Active Member of Clubs or Organizations: No    Attends Banker Meetings: Never    Marital Status: Married   CONSTITUTIONAL: Negative for chills, fatigue, fever, unintentional weight gain and unintentional weight loss.  E/N/T: Negative for ear pain, nasal congestion and sore throat.  CARDIOVASCULAR: Negative for chest pain, dizziness, palpitations and pedal edema.  RESPIRATORY: Negative for recent cough and dyspnea.   GASTROINTESTINAL: Negative for abdominal pain, acid reflux symptoms, constipation, diarrhea, nausea and vomiting.  MSK: Negative for arthralgias and myalgias.  INTEGUMENTARY: Negative for rash.  NEUROLOGICAL: Negative for dizziness and headaches.  PSYCHIATRIC: Negative for sleep disturbance and to question depression screen.  Negative for depression, negative for anhedonia.       Objective:  PHYSICAL EXAM:   VS: BP 120/84 (BP Location: Left Arm, Patient Position: Bed low/side rails up, Cuff Size: Normal)   Pulse 89   Temp 97.7 F (36.5 C) (Temporal)   Resp 18   Ht 5\' 2"  (1.575 m)   Wt 124 lb (56.2 kg)   SpO2 98%   BMI 22.68 kg/m   GEN: Well nourished, well developed, in no acute distress  Cardiac: RRR; no murmurs, rubs, or gallops,no edema -  Respiratory:  normal respiratory rate and pattern with no distress - normal breath sounds with no rales, rhonchi, wheezes or rubs  MS: no deformity or atrophy  Skin: warm and dry, no rash  Neuro:  Alert and Oriented x 3, - CN II-Xii grossly intact Psych: euthymic mood, appropriate affect and demeanor      05/09/2023    8:54 AM 11/06/2022    9:55 AM 02/14/2022    9:28 AM 07/15/2021    9:24 AM 10/05/2020    3:43 PM  Depression screen PHQ 2/9  Decreased Interest 0 0 0 0 0  Down, Depressed, Hopeless 0 0 1 0 0  PHQ - 2 Score 0 0 1 0 0  Altered sleeping 0 1 3 0 3  Tired, decreased energy 0 0 3 0 0  Change in appetite 0 0 0 0 0  Feeling bad or failure about yourself  0 0 0 0 0  Trouble concentrating 0 0 0 0 0  Moving slowly or fidgety/restless 0 0 0 0 0  Suicidal thoughts 0 0 0 0 0  PHQ-9 Score 0 1 7 0 3  Difficult doing work/chores Not difficult at all Not difficult at all Somewhat difficult  Not difficult at all     Lab Results  Component Value Date   WBC 9.5 11/06/2022   HGB 12.3 11/06/2022   HCT 36.8 11/06/2022   PLT 364 11/06/2022   GLUCOSE 89 11/06/2022   CHOL 159 11/06/2022   TRIG 158 (H) 11/06/2022   HDL 40  11/06/2022   LDLCALC 91 11/06/2022   ALT 18 11/06/2022   AST 19 11/06/2022   NA 143 11/06/2022   K 4.3 11/06/2022   CL 106 11/06/2022   CREATININE 0.75 11/06/2022   BUN 15 11/06/2022   CO2 23 11/06/2022   TSH 1.710 11/06/2022   INR 1.1 05/26/2020   HGBA1C 5.8 (H) 11/06/2022      Assessment & Plan:   Problem List Items Addressed This Visit       Digestive   Gastroesophageal reflux disease without esophagitis Continue current meds     Other   Mixed hyperlipidemia - Primary (Chronic)   Relevant Orders   CBC with Differential/Platelet   Comprehensive metabolic panel   Lipid panel Continue med   Moderate recurrent major depression (HCC)   Relevant Orders   TSH Continue meds     Anxiety       Relevant Orders   TSH Continue meds   Prediabetes       Relevant Orders   Hemoglobin A1c      Colon cancer screening Refer to GI         .  Meds ordered this encounter  Medications   atorvastatin (LIPITOR) 20 MG tablet    Sig: Take 1 tablet (20 mg total) by mouth daily.    Dispense:  90 tablet    Refill:  1    Supervising Provider:   COX, KIRSTEN [id number]   metoprolol succinate (TOPROL-XL) 50 MG 24 hr tablet    Sig: Take 1 tablet (50 mg total) by mouth daily. Take with or immediately following a meal.    Dispense:  90 tablet    Refill:  1    Supervising Provider:   COX, KIRSTEN [[id number]]   omeprazole (PRILOSEC) 20 MG capsule    Sig: Take 1 capsule (20 mg total) by mouth daily.    Dispense:  90 capsule  Refill:  1    Supervising Provider:   Blane Ohara [phone]    Orders Placed This Encounter  Procedures      CBC with Differential/Platelet   Comprehensive metabolic panel   TSH   Lipid panel   Hemoglobin A1c   Ambulatory referral to Gastroenterology     Follow-up: Return in about 6 months (around 11/06/2023) for chronic fasting follow-up.  An After Visit Summary was printed and given to the patient.  Jettie Pagan [facility] [phone]

## 2023-05-10 LAB — CBC WITH DIFFERENTIAL/PLATELET
Basophils Absolute: 0.1 10*3/uL (ref 0.0–0.2)
Basos: 1 %
EOS (ABSOLUTE): 0.3 10*3/uL (ref 0.0–0.4)
Eos: 4 %
Hematocrit: 42.4 % (ref 34.0–46.6)
Hemoglobin: 14.1 g/dL (ref 11.1–15.9)
Immature Grans (Abs): 0 10*3/uL (ref 0.0–0.1)
Immature Granulocytes: 0 %
Lymphocytes Absolute: 1.5 10*3/uL (ref 0.7–3.1)
Lymphs: 18 %
MCH: 30.8 pg (ref 26.6–33.0)
MCHC: 33.3 g/dL (ref 31.5–35.7)
MCV: 93 fL (ref 79–97)
Monocytes Absolute: 0.5 10*3/uL (ref 0.1–0.9)
Monocytes: 6 %
Neutrophils Absolute: 5.7 10*3/uL (ref 1.4–7.0)
Neutrophils: 71 %
Platelets: 331 10*3/uL (ref 150–450)
RBC: 4.58 x10E6/uL (ref 3.77–5.28)
RDW: 13.8 % (ref 11.7–15.4)
WBC: 8.1 10*3/uL (ref 3.4–10.8)

## 2023-05-10 LAB — COMPREHENSIVE METABOLIC PANEL
ALT: 13 [IU]/L (ref 0–32)
AST: 20 [IU]/L (ref 0–40)
Albumin: 4.6 g/dL (ref 3.9–4.9)
Alkaline Phosphatase: 121 [IU]/L (ref 44–121)
BUN/Creatinine Ratio: 22 (ref 12–28)
BUN: 19 mg/dL (ref 8–27)
Bilirubin Total: 0.3 mg/dL (ref 0.0–1.2)
CO2: 21 mmol/L (ref 20–29)
Calcium: 9.6 mg/dL (ref 8.7–10.3)
Chloride: 103 mmol/L (ref 96–106)
Creatinine, Ser: 0.85 mg/dL (ref 0.57–1.00)
Globulin, Total: 2.3 g/dL (ref 1.5–4.5)
Glucose: 89 mg/dL (ref 70–99)
Potassium: 3.8 mmol/L (ref 3.5–5.2)
Sodium: 141 mmol/L (ref 134–144)
Total Protein: 6.9 g/dL (ref 6.0–8.5)
eGFR: 77 mL/min/{1.73_m2} (ref >59)

## 2023-05-10 LAB — TSH: TSH: 1.52 u[IU]/mL (ref 0.450–4.500)

## 2023-05-10 LAB — LIPID PANEL
Chol/HDL Ratio: 5.7 {ratio} — ABNORMAL HIGH (ref 0.0–4.4)
Cholesterol, Total: 283 mg/dL — ABNORMAL HIGH (ref 100–199)
HDL: 50 mg/dL (ref >39)
LDL Chol Calc (NIH): 206 mg/dL — ABNORMAL HIGH (ref 0–99)
Triglycerides: 147 mg/dL (ref 0–149)
VLDL Cholesterol Cal: 27 mg/dL (ref 5–40)

## 2023-05-10 LAB — HEMOGLOBIN A1C
Est. average glucose Bld gHb Est-mCnc: 117 mg/dL
Hgb A1c MFr Bld: 5.7 % — ABNORMAL HIGH (ref 4.8–5.6)

## 2023-05-15 ENCOUNTER — Other Ambulatory Visit: Payer: Self-pay | Admitting: Physician Assistant

## 2023-05-15 DIAGNOSIS — E782 Mixed hyperlipidemia: Secondary | ICD-10-CM

## 2023-05-15 MED ORDER — ATORVASTATIN CALCIUM 40 MG PO TABS: 40.0000 mg | ORAL_TABLET | Freq: Every day | ORAL | 1 refills | Status: DC

## 2023-05-31 ENCOUNTER — Other Ambulatory Visit: Payer: Self-pay | Admitting: Physician Assistant

## 2023-05-31 DIAGNOSIS — F411 Generalized anxiety disorder: Secondary | ICD-10-CM

## 2023-05-31 DIAGNOSIS — F321 Major depressive disorder, single episode, moderate: Secondary | ICD-10-CM

## 2023-07-05 ENCOUNTER — Other Ambulatory Visit: Payer: Self-pay | Admitting: Physician Assistant

## 2023-07-05 DIAGNOSIS — F321 Major depressive disorder, single episode, moderate: Secondary | ICD-10-CM

## 2023-07-05 DIAGNOSIS — F411 Generalized anxiety disorder: Secondary | ICD-10-CM

## 2023-08-01 ENCOUNTER — Telehealth: Payer: Self-pay | Admitting: Physician Assistant

## 2023-08-01 NOTE — Telephone Encounter (Signed)
 Called to reschedule patient's appointment on June 4th with Dwain Giovanni. Dwain Giovanni will be out of the office. Left a voicemail for patient to call the office to reschedule the appointment.

## 2023-08-03 ENCOUNTER — Other Ambulatory Visit: Payer: Self-pay | Admitting: Physician Assistant

## 2023-08-08 ENCOUNTER — Ambulatory Visit: Payer: Self-pay

## 2023-08-08 NOTE — Telephone Encounter (Signed)
 Copied from CRM [id number]. Topic: Clinical - Red Word Triage >> Aug 08, 2023 12:06 PM Baldemar Lev wrote: Red Word that prompted transfer to Nurse Triage: Stress/ Anxiety  Chief Complaint: Anxiety/stress Symptoms: Tearful, difficulty sleeping, vomiting dinner, trouble catching her breath Frequency: About 2 months, exacerbated today Pertinent Negatives: Patient denies thoughts of harming others Disposition: [] ED /[] Urgent Care (no appt availability in office) / [x] Appointment(In office/virtual)/ []  [facility]/ [] Home Care/ [] Refused Recommended Disposition /[] [facility]/ []  Follow-up with PCP Additional Notes: Patient called in because she experiencing high anxiety/stress at this time. Patient was tearful and panicky while on the phone with this RN. Patient stated her husband asked for a divorce this morning and that is what triggered symptoms. Patient stated she also recently lost her dog and grandson. Patient stated that she had thoughts of "running off the road" on her way to work this morning. Patient denied a plan and access to weapons to harm herself. Patient stated she would never harm herself because she has daughters who depend on her. Patient denied thoughts of harming others. This RN encouraged patient to take deep breaths and stayed on the line with patient until she was able to calm down. Patient is at work at this time. Patient requested to see a female provider in the office tomorrow and stated the ED is not an option for her. No availability with PCP. Scheduled with alternate provider in office for tomorrow morning. Provided care advice and instructed patient to call back if symptoms worsen or if she has SI. Patient verbalized understanding.   Reason for Disposition  Patient sounds very upset or troubled to the triager  Answer Assessment - Initial Assessment Questions 1. CONCERN: "Did anything happen that prompted you to call today?"      Husband stated he wanted  a divorce this morning 2. ANXIETY SYMPTOMS: "Can you describe how you (your loved one; patient) have been feeling?" (e.g., tense, restless, panicky, anxious, keyed up, overwhelmed, sense of impending doom).      Patient is very overwhelmed and tearful while on the phone with this RN 3. ONSET: "How long have you been feeling this way?" (e.g., hours, days, weeks)     Since March 4. SEVERITY: "How would you rate the level of anxiety?" (e.g., 0 - 10; or mild, moderate, severe).     Severe at this time 5. FUNCTIONAL IMPAIRMENT: "How have these feelings affected your ability to do daily activities?" "Have you had more difficulty than usual doing your normal daily activities?" (e.g., getting better, same, worse; self-care, school, work, interactions)     States she only performs self-care on the days she has to work 7. RISK OF HARM - SUICIDAL IDEATION: "Do you ever have thoughts of hurting or killing yourself?" If Yes, ask:  "Do you have these feelings now?" "Do you have a plan on how you would do this?"     States she "would like to", but won't because her daughters need her here Denies plan and access to weapon  8. TREATMENT:  "What has been done so far to treat this anxiety?" (e.g., medicines, relaxation strategies). "What has helped?"     This RN advised patient to take deep breaths while on the phone 9. TREATMENT - THERAPIST: "Do you have a counselor or therapist? Name?"     Open to a therapist/counselor  10. POTENTIAL TRIGGERS: "Do you drink caffeinated beverages (e.g., coffee, colas, teas), and how much daily?" "Do you drink alcohol or  use any drugs?" "Have you started any new medicines recently?"       Husband asked for a divorce this morning, dog passed away recently, grandson passed away recently 11. PATIENT SUPPORT: "Who is with you now?" "Who do you live with?" "Do you have family or friends who you can talk to?"        States she has daughters, but she does not want to burden them with these  things 12. OTHER SYMPTOMS: "Do you have any other symptoms?" (e.g., feeling depressed, trouble concentrating, trouble sleeping, trouble breathing, palpitations or fast heartbeat, chest pain, sweating, nausea, or diarrhea)     Difficulty sleeping, throwing up dinner Having a hard time catching her breath, heart is pounding, patient was able to calm down and catch her breath while on phone with this RN Ran out of Xanax  about a month ago  Protocols used: Anxiety and Panic Attack-A-AH

## 2023-08-09 ENCOUNTER — Ambulatory Visit: Admitting: Physician Assistant

## 2023-08-09 ENCOUNTER — Ambulatory Visit: Admitting: Family Medicine

## 2023-08-09 NOTE — Progress Notes (Unsigned)
 Acute Office Visit  Subjective:    Patient ID: Kimberly David, female    DOB: [birth year], 64 y.o.   MRN: [id number]  Chief Complaint  Patient presents with   Anxiety    HPI: Patient is in today for anxiety.  Past Medical History:  Diagnosis Date   Abnormal blood chemistry 10/27/2019   Angina pectoris (HCC) 01/30/2020   Anxiety    Cardiac murmur 05/09/2019   COVID-19    Depression    Depression, major, single episode, moderate (HCC) 05/06/2019   Easy bruising 10/27/2019   Eustachian tube disorder 12/23/2019   Ganglion cyst of wrist    Rt   History of severe acute respiratory syndrome coronavirus 2 (SARS-CoV-2) disease 03/02/2019   Hyperlipidemia    takes lipitor   Mixed hyperlipidemia 05/06/2019   Moderate recurrent major depression (HCC) 06/16/2019   Need for tetanus, diphtheria, and acellular pertussis (Tdap) vaccine 03/17/2020   Other fatigue 07/16/2019   Otitis media 12/23/2019   Palpitation 05/06/2019   Palpitations 05/09/2019    Past Surgical History:  Procedure Laterality Date   CESAREAN SECTION  02/08/2001   GANGLION CYST EXCISION  01/11/2012   Procedure: REMOVAL GANGLION OF WRIST;  Surgeon: Milagros Alf, MD;  Location: [facility];  Service: Orthopedics;  Laterality: Right;  right wrist excision mass     Family History  Problem Relation Age of Onset   Hyperlipidemia Mother    Heart attack Mother    Migraines Mother    Lung cancer Father    Diabetes type II Sister    Migraines Sister    Diabetes type I Daughter     Social History   Socioeconomic History   Marital status: Legally Separated    Spouse name: Not on file   Number of children: 3   Years of education: Not on file   Highest education level: Not on file  Occupational History   Not on file  Tobacco Use   Smoking status: Never   Smokeless tobacco: Never  Vaping Use   Vaping status: Never Used  Substance and Sexual Activity   Alcohol use: No   Drug use: No   Sexual  activity: Yes    Birth control/protection: Post-menopausal  Other Topics Concern   Not on file  Social History Narrative   Not on file   Social Drivers of Health   Financial Resource Strain: Low Risk  (11/06/2022)   Overall Financial Resource Strain (CARDIA)    Difficulty of Paying Living Expenses: Not hard at all  Food Insecurity: No Food Insecurity (11/06/2022)   Hunger Vital Sign    Worried About Running Out of Food in the Last Year: Never true    Ran Out of Food in the Last Year: Never true  Transportation Needs: No Transportation Needs (11/06/2022)   PRAPARE - Administrator, Civil Service (Medical): No    Lack of Transportation (Non-Medical): No  Physical Activity: Sufficiently Active (11/06/2022)   Exercise Vital Sign    Days of Exercise per Week: 7 days    Minutes of Exercise per Session: 30 min  Stress: No Stress Concern Present (11/06/2022)   [employer] of Occupational Health - Occupational Stress Questionnaire    Feeling of Stress : Not at all  Social Connections: Moderately Isolated (11/06/2022)   Social Connection and Isolation Panel [NHANES]    Frequency of Communication with Friends and Family: More than three times a week    Frequency of Social Gatherings  with Friends and Family: Three times a week    Attends Religious Services: Never    Active Member of Clubs or Organizations: No    Attends Banker Meetings: Never    Marital Status: Married  Catering manager Violence: Not At Risk (11/06/2022)   Humiliation, Afraid, Rape, and Kick questionnaire    Fear of Current or Ex-Partner: No    Emotionally Abused: No    Physically Abused: No    Sexually Abused: No    Outpatient Medications Prior to Visit  Medication Sig Dispense Refill   albuterol  (VENTOLIN  HFA) 108 (90 Base) MCG/ACT inhaler Inhale 1-2 puffs into the lungs every 4 (four) hours as needed for shortness of breath.     ALPRAZolam  (XANAX ) 0.25 MG tablet TAKE 1 TABLET BY MOUTH  EVERY DAY AT BEDTIME AS NEEDED 30 tablet 0   aspirin  EC 81 MG tablet Take 1 tablet (81 mg total) by mouth daily. Swallow whole. 90 tablet 3   atorvastatin  (LIPITOR) 40 MG tablet Take 1 tablet (40 mg total) by mouth daily. 90 tablet 1   FYAVOLV 1-5 MG-MCG TABS tablet Take 1 tablet by mouth daily.     meclizine  (ANTIVERT ) 12.5 MG tablet TAKE 1 TABLET (12.5 MG TOTAL) BY MOUTH 2 (TWO) TIMES DAILY AS NEEDED FOR DIZZINESS. 60 tablet 0   meloxicam  (MOBIC ) 15 MG tablet TAKE 1 TABLET BY MOUTH EVERY DAY 90 tablet 0   metoprolol  succinate (TOPROL -XL) 50 MG 24 hr tablet Take 1 tablet (50 mg total) by mouth daily. Take with or immediately following a meal. 90 tablet 1   nitroGLYCERIN  (NITROSTAT ) 0.4 MG SL tablet Place 0.4 mg under the tongue every 5 (five) minutes as needed for chest pain.     omeprazole  (PRILOSEC) 20 MG capsule Take 1 capsule (20 mg total) by mouth daily. 90 capsule 1   venlafaxine  XR (EFFEXOR -XR) 150 MG 24 hr capsule TAKE 1 CAPSULE BY MOUTH DAILY WITH BREAKFAST. 90 capsule 0   No facility-administered medications prior to visit.    Allergies  Allergen Reactions   Codeine Nausea And Vomiting    Review of Systems     Objective:         05/09/2023    8:52 AM 11/06/2022    9:44 AM 02/14/2022    9:26 AM  Vitals with BMI  Height 5\' 2"  5\' 2"  5\' 2"   Weight 124 lbs 120 lbs 10 oz 126 lbs  BMI 22.67 22.05 23.04  Systolic 120 130 161  Diastolic 84 80 72  Pulse 89 100 99    No data found.   Physical Exam  Health Maintenance Due  Topic Date Due   Colonoscopy  Never done    There are no preventive care reminders to display for this patient.   Lab Results  Component Value Date   TSH 1.520 05/09/2023   Lab Results  Component Value Date   WBC 8.1 05/09/2023   HGB 14.1 05/09/2023   HCT 42.4 05/09/2023   MCV 93 05/09/2023   PLT 331 05/09/2023   Lab Results  Component Value Date   NA 141 05/09/2023   K 3.8 05/09/2023   CO2 21 05/09/2023   GLUCOSE 89 05/09/2023    BUN 19 05/09/2023   CREATININE 0.85 05/09/2023   BILITOT 0.3 05/09/2023   ALKPHOS 121 05/09/2023   AST 20 05/09/2023   ALT 13 05/09/2023   PROT 6.9 05/09/2023   ALBUMIN 4.6 05/09/2023   CALCIUM  9.6 05/09/2023   ANIONGAP 8  05/28/2020   EGFR 77 05/09/2023   Lab Results  Component Value Date   CHOL 283 (H) 05/09/2023   Lab Results  Component Value Date   HDL 50 05/09/2023   Lab Results  Component Value Date   LDLCALC 206 (H) 05/09/2023   Lab Results  Component Value Date   TRIG 147 05/09/2023   Lab Results  Component Value Date   CHOLHDL 5.7 (H) 05/09/2023   Lab Results  Component Value Date   HGBA1C 5.7 (H) 05/09/2023       Assessment & Plan:  There are no diagnoses linked to this encounter.   No orders of the defined types were placed in this encounter.   No orders of the defined types were placed in this encounter.    Follow-up: No follow-ups on file.  An After Visit Summary was printed and given to the patient.  Janece Means, FNP [facility] [phone]

## 2023-08-10 ENCOUNTER — Encounter: Payer: Self-pay | Admitting: Family Medicine

## 2023-08-10 NOTE — Progress Notes (Signed)
 This encounter was created in error - please disregard.

## 2023-08-15 ENCOUNTER — Ambulatory Visit: Admitting: Physician Assistant

## 2023-08-23 ENCOUNTER — Ambulatory Visit: Admitting: Physician Assistant

## 2023-08-24 ENCOUNTER — Ambulatory Visit: Admitting: Physician Assistant

## 2023-10-15 DIAGNOSIS — F411 Generalized anxiety disorder: Secondary | ICD-10-CM | POA: Diagnosis not present

## 2023-11-07 ENCOUNTER — Ambulatory Visit: Payer: BC Managed Care – PPO | Admitting: Physician Assistant

## 2023-11-07 DIAGNOSIS — G4489 Other headache syndrome: Secondary | ICD-10-CM | POA: Diagnosis not present

## 2023-11-07 DIAGNOSIS — I10 Essential (primary) hypertension: Secondary | ICD-10-CM | POA: Diagnosis not present

## 2023-11-14 ENCOUNTER — Encounter: Payer: Self-pay | Admitting: Physician Assistant

## 2023-11-14 ENCOUNTER — Ambulatory Visit (INDEPENDENT_AMBULATORY_CARE_PROVIDER_SITE_OTHER): Admitting: Physician Assistant

## 2023-11-14 VITALS — BP 110/78 | HR 100 | Temp 97.9°F | Ht 62.0 in | Wt 121.6 lb

## 2023-11-14 DIAGNOSIS — Z23 Encounter for immunization: Secondary | ICD-10-CM

## 2023-11-14 DIAGNOSIS — E782 Mixed hyperlipidemia: Secondary | ICD-10-CM | POA: Diagnosis not present

## 2023-11-14 DIAGNOSIS — Z1211 Encounter for screening for malignant neoplasm of colon: Secondary | ICD-10-CM

## 2023-11-14 DIAGNOSIS — F411 Generalized anxiety disorder: Secondary | ICD-10-CM

## 2023-11-14 DIAGNOSIS — K219 Gastro-esophageal reflux disease without esophagitis: Secondary | ICD-10-CM

## 2023-11-14 DIAGNOSIS — R928 Other abnormal and inconclusive findings on diagnostic imaging of breast: Secondary | ICD-10-CM

## 2023-11-14 DIAGNOSIS — F331 Major depressive disorder, recurrent, moderate: Secondary | ICD-10-CM | POA: Diagnosis not present

## 2023-11-14 DIAGNOSIS — R7303 Prediabetes: Secondary | ICD-10-CM

## 2023-11-14 NOTE — Progress Notes (Signed)
 Subjective:  Patient ID: Kimberly David, female    DOB: 03-12-1961  Age: 63 y.o. MRN: [id number]  Chief Complaint  Patient presents with   Medical Management of Chronic Issues    HPI  Pt presents for follow up of hyperlipidemia The patient is tolerating the medication well without side effects. Compliance with treatment has been good; including taking medication as directed , maintains a healthy diet and regular exercise regimen , and following up as directed. Currently taking lipitor 40mg  qd   Pt presents for follow up of palpitations The patient is tolerating the medication well without side effects. Compliance with treatment has been good; including taking medication as directed , maintains a healthy diet and regular exercise regimen , and following up as directed. Currently taking toprol  XL 50mg  qd   Pt with history of prediabetes - due for labwork - is trying to watch diet - due to check hgb a1c  Pt with history of GERD - stable on prilosec 20mg  qd - requests refill of med  Pt with history of anxiety /depression.  States overall she is doing well  - she is currently taking effexor  XR and alprazolam  as needed  Pt is due for diagnostic mammogram and ultrasound  Pt would like flu shot and pneumonia shot today   Current Outpatient Medications on File Prior to Visit  Medication Sig Dispense Refill   albuterol  (VENTOLIN  HFA) 108 (90 Base) MCG/ACT inhaler Inhale 1-2 puffs into the lungs every 4 (four) hours as needed for shortness of breath.     ALPRAZolam  (XANAX ) 0.25 MG tablet TAKE 1 TABLET BY MOUTH EVERY DAY AT BEDTIME AS NEEDED 30 tablet 0   aspirin  EC 81 MG tablet Take 1 tablet (81 mg total) by mouth daily. Swallow whole. 90 tablet 3   atorvastatin  (LIPITOR) 40 MG tablet Take 1 tablet (40 mg total) by mouth daily. 90 tablet 1   FYAVOLV 1-5 MG-MCG TABS tablet Take 1 tablet by mouth daily.     meclizine  (ANTIVERT ) 12.5 MG tablet TAKE 1 TABLET (12.5 MG TOTAL) BY MOUTH 2  (TWO) TIMES DAILY AS NEEDED FOR DIZZINESS. 60 tablet 0   meloxicam  (MOBIC ) 15 MG tablet TAKE 1 TABLET BY MOUTH EVERY DAY 90 tablet 0   metoprolol  succinate (TOPROL -XL) 50 MG 24 hr tablet Take 1 tablet (50 mg total) by mouth daily. Take with or immediately following a meal. 90 tablet 1   nitroGLYCERIN  (NITROSTAT ) 0.4 MG SL tablet Place 0.4 mg under the tongue every 5 (five) minutes as needed for chest pain.     omeprazole  (PRILOSEC) 20 MG capsule Take 1 capsule (20 mg total) by mouth daily. 90 capsule 1   venlafaxine  XR (EFFEXOR -XR) 150 MG 24 hr capsule TAKE 1 CAPSULE BY MOUTH DAILY WITH BREAKFAST. 90 capsule 0   No current facility-administered medications on file prior to visit.   Past Medical History:  Diagnosis Date   Abnormal blood chemistry 10/27/2019   Angina pectoris (HCC) 01/30/2020   Anxiety    Cardiac murmur 05/09/2019   COVID-19    Depression    Depression, major, single episode, moderate (HCC) 05/06/2019   Easy bruising 10/27/2019   Eustachian tube disorder 12/23/2019   Ganglion cyst of wrist    Rt   History of severe acute respiratory syndrome coronavirus 2 (SARS-CoV-2) disease 03/02/2019   Hyperlipidemia    takes lipitor   Mixed hyperlipidemia 05/06/2019   Moderate recurrent major depression (HCC) 06/16/2019   Need for tetanus, diphtheria, and acellular  pertussis (Tdap) vaccine 03/17/2020   Other fatigue 07/16/2019   Otitis media 12/23/2019   Palpitation 05/06/2019   Palpitations 05/09/2019   Past Surgical History:  Procedure Laterality Date   CESAREAN SECTION  02/08/2001   GANGLION CYST EXCISION  01/11/2012   Procedure: REMOVAL GANGLION OF WRIST;  Surgeon: Franky JONELLE Curia, MD;  Location: [facility];  Service: Orthopedics;  Laterality: Right;  right wrist excision mass     Family History  Problem Relation Age of Onset   Hyperlipidemia Mother    Heart attack Mother    Migraines Mother    Lung cancer Father    Diabetes type II Sister    Migraines Sister     Diabetes type I Daughter    Social History   Socioeconomic History   Marital status: Legally Separated    Spouse name: Not on file   Number of children: 3   Years of education: Not on file   Highest education level: Not on file  Occupational History   Not on file  Tobacco Use   Smoking status: Never   Smokeless tobacco: Never  Vaping Use   Vaping status: Never Used  Substance and Sexual Activity   Alcohol use: No   Drug use: No   Sexual activity: Yes    Birth control/protection: Post-menopausal  Other Topics Concern   Not on file  Social History Narrative   Not on file   Social Drivers of Health   Financial Resource Strain: Low Risk  (11/06/2022)   Overall Financial Resource Strain (CARDIA)    Difficulty of Paying Living Expenses: Not hard at all  Food Insecurity: No Food Insecurity (11/06/2022)   Hunger Vital Sign    Worried About Running Out of Food in the Last Year: Never true    Ran Out of Food in the Last Year: Never true  Transportation Needs: No Transportation Needs (11/06/2022)   PRAPARE - Administrator, Civil Service (Medical): No    Lack of Transportation (Non-Medical): No  Physical Activity: Sufficiently Active (11/06/2022)   Exercise Vital Sign    Days of Exercise per Week: 7 days    Minutes of Exercise per Session: 30 min  Stress: No Stress Concern Present (11/06/2022)   [employer] of Occupational Health - Occupational Stress Questionnaire    Feeling of Stress : Not at all  Social Connections: Moderately Isolated (11/06/2022)   Social Connection and Isolation Panel    Frequency of Communication with Friends and Family: More than three times a week    Frequency of Social Gatherings with Friends and Family: Three times a week    Attends Religious Services: Never    Active Member of Clubs or Organizations: No    Attends Banker Meetings: Never    Marital Status: Married   CONSTITUTIONAL: Negative for chills, fatigue, fever,  unintentional weight gain and unintentional weight loss.  E/N/T: Negative for ear pain, nasal congestion and sore throat.  CARDIOVASCULAR: Negative for chest pain, dizziness, palpitations and pedal edema.  RESPIRATORY: Negative for recent cough and dyspnea.  GASTROINTESTINAL: Negative for abdominal pain, acid reflux symptoms, constipation, diarrhea, nausea and vomiting.  MSK: Negative for arthralgias and myalgias.  INTEGUMENTARY: Negative for rash.  NEUROLOGICAL: Negative for dizziness and headaches.  PSYCHIATRIC: Negative for sleep disturbance and to question depression screen.  Negative for depression, negative for anhedonia.       Objective:  PHYSICAL EXAM:   VS: BP 110/78   Pulse 100  Temp 97.9 F (36.6 C)   Ht 5' 2 (1.575 m)   Wt 121 lb 9.6 oz (55.2 kg)   SpO2 98%   BMI 22.24 kg/m   GEN: Well nourished, well developed, in no acute distress   Cardiac: RRR; no murmurs, rubs, or gallops,no edema -  Respiratory:  normal respiratory rate and pattern with no distress - normal breath sounds with no rales, rhonchi, wheezes or rubs  MS: no deformity or atrophy  Skin: warm and dry, no rash  Neuro:  Alert and Oriented x 3, - CN II-Xii grossly intact Psych: euthymic mood, appropriate affect and demeanor      11/14/2023    9:40 AM 05/09/2023    8:54 AM 11/06/2022    9:55 AM 02/14/2022    9:28 AM 07/15/2021    9:24 AM  Depression screen PHQ 2/9  Decreased Interest 1 0 0 0 0  Down, Depressed, Hopeless 2 0 0 1 0  PHQ - 2 Score 3 0 0 1 0  Altered sleeping 0 0 1 3 0  Tired, decreased energy 1 0 0 3 0  Change in appetite 0 0 0 0 0  Feeling bad or failure about yourself  0 0 0 0 0  Trouble concentrating 0 0 0 0 0  Moving slowly or fidgety/restless 0 0 0 0 0  Suicidal thoughts 0 0 0 0 0  PHQ-9 Score 4 0 1 7 0  Difficult doing work/chores Not difficult at all Not difficult at all Not difficult at all Somewhat difficult      Lab Results  Component Value Date   WBC 8.1 05/09/2023    HGB 14.1 05/09/2023   HCT 42.4 05/09/2023   PLT 331 05/09/2023   GLUCOSE 89 05/09/2023   CHOL 283 (H) 05/09/2023   TRIG 147 05/09/2023   HDL 50 05/09/2023   LDLCALC 206 (H) 05/09/2023   ALT 13 05/09/2023   AST 20 05/09/2023   NA 141 05/09/2023   K 3.8 05/09/2023   CL 103 05/09/2023   CREATININE 0.85 05/09/2023   BUN 19 05/09/2023   CO2 21 05/09/2023   TSH 1.520 05/09/2023   INR 1.1 05/26/2020   HGBA1C 5.7 (H) 05/09/2023      Assessment & Plan:   Problem List Items Addressed This Visit       Digestive   Gastroesophageal reflux disease without esophagitis Continue current meds     Other   Mixed hyperlipidemia - Primary (Chronic)   Relevant Orders   CBC with Differential/Platelet   Comprehensive metabolic panel   Lipid panel Continue med   Moderate recurrent major depression (HCC)   Relevant Orders   TSH Continue meds     Anxiety       Relevant Orders   TSH Continue meds   Prediabetes       Relevant Orders   Hemoglobin A1c      Colon cancer screening Pt given number to reach out to [facility] - referral made at last visit and pt did not call back yet to schedule     Abnormal mammogram Diagnostic bilateral mm with left breast ultrasound ordered  Need flu shot Flublok given  Need pneumonia shot Pneumo 20- given     .  No orders of the defined types were placed in this encounter.   Orders Placed This Encounter  Procedures      CBC with Differential/Platelet   Comprehensive metabolic panel   TSH   Lipid panel  Hemoglobin A1c   Ambulatory referral to Gastroenterology     Follow-up: Return in about 6 months (around 05/13/2024) for chronic fasting follow-up.  An After Visit Summary was printed and given to the patient.  CAMIE JONELLE NICHOLAUS DEVONNA [facility] [phone]

## 2023-11-14 NOTE — Patient Instructions (Signed)
 If I have ordered a referral, lab work, or a test, please watch for messages/letters in your [facility]. Please be aware of unknown numbers, as this may be a specialist's office attempting to call and schedule your appointment. You may wish to enter the specialist's phone number in your contacts, so your phone will not block the calls as SPAM. If you have NOT been contacted with in 2 weeks: Please call the specialist's office  Call [facility].

## 2023-11-15 ENCOUNTER — Ambulatory Visit: Payer: Self-pay | Admitting: Physician Assistant

## 2023-11-15 LAB — CBC WITH DIFFERENTIAL/PLATELET
Basophils Absolute: 0.1 x10E3/uL (ref 0.0–0.2)
Basos: 2 %
EOS (ABSOLUTE): 0.4 x10E3/uL (ref 0.0–0.4)
Eos: 5 %
Hematocrit: 39.9 % (ref 34.0–46.6)
Hemoglobin: 13 g/dL (ref 11.1–15.9)
Immature Grans (Abs): 0 x10E3/uL (ref 0.0–0.1)
Immature Granulocytes: 0 %
Lymphocytes Absolute: 1.7 x10E3/uL (ref 0.7–3.1)
Lymphs: 22 %
MCH: 30.2 pg (ref 26.6–33.0)
MCHC: 32.6 g/dL (ref 31.5–35.7)
MCV: 93 fL (ref 79–97)
Monocytes Absolute: 0.5 x10E3/uL (ref 0.1–0.9)
Monocytes: 7 %
Neutrophils Absolute: 5 x10E3/uL (ref 1.4–7.0)
Neutrophils: 64 %
Platelets: 320 x10E3/uL (ref 150–450)
RBC: 4.3 x10E6/uL (ref 3.77–5.28)
RDW: 13.3 % (ref 11.7–15.4)
WBC: 7.7 x10E3/uL (ref 3.4–10.8)

## 2023-11-15 LAB — HEMOGLOBIN A1C
Est. average glucose Bld gHb Est-mCnc: 128 mg/dL
Hgb A1c MFr Bld: 6.1 % — ABNORMAL HIGH (ref 4.8–5.6)

## 2023-11-15 LAB — LIPID PANEL
Chol/HDL Ratio: 3.4 ratio (ref 0.0–4.4)
Cholesterol, Total: 171 mg/dL (ref 100–199)
HDL: 51 mg/dL (ref >39)
LDL Chol Calc (NIH): 94 mg/dL (ref 0–99)
Triglycerides: 150 mg/dL — ABNORMAL HIGH (ref 0–149)
VLDL Cholesterol Cal: 26 mg/dL (ref 5–40)

## 2023-11-15 LAB — COMPREHENSIVE METABOLIC PANEL WITH GFR
ALT: 17 IU/L (ref 0–32)
AST: 19 IU/L (ref 0–40)
Albumin: 4.3 g/dL (ref 3.9–4.9)
Alkaline Phosphatase: 127 IU/L — ABNORMAL HIGH (ref 44–121)
BUN/Creatinine Ratio: 21 (ref 12–28)
BUN: 14 mg/dL (ref 8–27)
Bilirubin Total: 0.2 mg/dL (ref 0.0–1.2)
CO2: 22 mmol/L (ref 20–29)
Calcium: 9.3 mg/dL (ref 8.7–10.3)
Chloride: 105 mmol/L (ref 96–106)
Creatinine, Ser: 0.67 mg/dL (ref 0.57–1.00)
Globulin, Total: 2.2 g/dL (ref 1.5–4.5)
Glucose: 100 mg/dL — ABNORMAL HIGH (ref 70–99)
Potassium: 3.7 mmol/L (ref 3.5–5.2)
Sodium: 141 mmol/L (ref 134–144)
Total Protein: 6.5 g/dL (ref 6.0–8.5)
eGFR: 98 mL/min/1.73 (ref >59)

## 2023-11-15 LAB — TSH: TSH: 1.26 u[IU]/mL (ref 0.450–4.500)

## 2023-12-24 ENCOUNTER — Other Ambulatory Visit: Payer: Self-pay

## 2023-12-24 ENCOUNTER — Emergency Department (HOSPITAL_COMMUNITY)

## 2023-12-24 ENCOUNTER — Encounter (HOSPITAL_COMMUNITY): Payer: Self-pay | Admitting: Emergency Medicine

## 2023-12-24 ENCOUNTER — Emergency Department (HOSPITAL_COMMUNITY)
Admission: EM | Admit: 2023-12-24 | Discharge: 2023-12-24 | Disposition: A | Discharge status: Home / Self Care | Attending: Emergency Medicine | Admitting: Emergency Medicine

## 2023-12-24 DIAGNOSIS — S20219A Contusion of unspecified front wall of thorax, initial encounter: Secondary | ICD-10-CM | POA: Insufficient documentation

## 2023-12-24 DIAGNOSIS — E876 Hypokalemia: Secondary | ICD-10-CM | POA: Insufficient documentation

## 2023-12-24 DIAGNOSIS — S39012A Strain of muscle, fascia and tendon of lower back, initial encounter: Secondary | ICD-10-CM | POA: Insufficient documentation

## 2023-12-24 DIAGNOSIS — I10 Essential (primary) hypertension: Secondary | ICD-10-CM | POA: Diagnosis not present

## 2023-12-24 DIAGNOSIS — R000 Tachycardia, unspecified: Secondary | ICD-10-CM | POA: Diagnosis not present

## 2023-12-24 DIAGNOSIS — Y92410 Unspecified street and highway as the place of occurrence of the external cause: Secondary | ICD-10-CM | POA: Insufficient documentation

## 2023-12-24 DIAGNOSIS — S0990XA Unspecified injury of head, initial encounter: Secondary | ICD-10-CM | POA: Diagnosis not present

## 2023-12-24 DIAGNOSIS — S060X0A Concussion without loss of consciousness, initial encounter: Secondary | ICD-10-CM | POA: Insufficient documentation

## 2023-12-24 DIAGNOSIS — R079 Chest pain, unspecified: Secondary | ICD-10-CM | POA: Diagnosis not present

## 2023-12-24 DIAGNOSIS — Z7982 Long term (current) use of aspirin: Secondary | ICD-10-CM | POA: Diagnosis not present

## 2023-12-24 DIAGNOSIS — I6782 Cerebral ischemia: Secondary | ICD-10-CM | POA: Diagnosis not present

## 2023-12-24 LAB — COMPREHENSIVE METABOLIC PANEL WITH GFR
ALT: 17 U/L (ref 0–44)
AST: 27 U/L (ref 15–41)
Albumin: 3.8 g/dL (ref 3.5–5.0)
Alkaline Phosphatase: 111 U/L (ref 38–126)
Anion gap: 12 (ref 5–15)
BUN: 15 mg/dL (ref 8–23)
CO2: 20 mmol/L — ABNORMAL LOW (ref 22–32)
Calcium: 9.2 mg/dL (ref 8.9–10.3)
Chloride: 105 mmol/L (ref 98–111)
Creatinine, Ser: 0.79 mg/dL (ref 0.44–1.00)
GFR, Estimated: >60 mL/min (ref >60)
Glucose, Bld: 189 mg/dL — ABNORMAL HIGH (ref 70–99)
Potassium: 3.2 mmol/L — ABNORMAL LOW (ref 3.5–5.1)
Sodium: 137 mmol/L (ref 135–145)
Total Bilirubin: 0.5 mg/dL (ref 0.0–1.2)
Total Protein: 6.8 g/dL (ref 6.5–8.1)

## 2023-12-24 LAB — I-STAT CHEM 8, ED
BUN: 16 mg/dL (ref 8–23)
Calcium, Ion: 1.16 mmol/L (ref 1.15–1.40)
Chloride: 107 mmol/L (ref 98–111)
Creatinine, Ser: 0.7 mg/dL (ref 0.44–1.00)
Glucose, Bld: 187 mg/dL — ABNORMAL HIGH (ref 70–99)
HCT: 38 % (ref 36.0–46.0)
Hemoglobin: 12.9 g/dL (ref 12.0–15.0)
Potassium: 3.2 mmol/L — ABNORMAL LOW (ref 3.5–5.1)
Sodium: 141 mmol/L (ref 135–145)
TCO2: 20 mmol/L — ABNORMAL LOW (ref 22–32)

## 2023-12-24 LAB — CBC
HCT: 39.2 % (ref 36.0–46.0)
Hemoglobin: 13.2 g/dL (ref 12.0–15.0)
MCH: 30.6 pg (ref 26.0–34.0)
MCHC: 33.7 g/dL (ref 30.0–36.0)
MCV: 91 fL (ref 80.0–100.0)
Platelets: 319 K/uL (ref 150–400)
RBC: 4.31 MIL/uL (ref 3.87–5.11)
RDW: 13.5 % (ref 11.5–15.5)
WBC: 7 K/uL (ref 4.0–10.5)
nRBC: 0 % (ref 0.0–0.2)

## 2023-12-24 LAB — ETHANOL: Alcohol, Ethyl (B): <15 mg/dL (ref <15)

## 2023-12-24 LAB — I-STAT CG4 LACTIC ACID, ED: Lactic Acid, Venous: 2.4 mmol/L (ref 0.5–1.9)

## 2023-12-24 MED ORDER — FENTANYL CITRATE (PF) 50 MCG/ML IJ SOSY
50.0000 ug | PREFILLED_SYRINGE | Freq: Once | INTRAMUSCULAR | Status: AC
  Administered 2023-12-24: 50 ug via INTRAVENOUS
  Filled 2023-12-24: qty 1

## 2023-12-24 MED ORDER — IOHEXOL 350 MG/ML SOLN
75.0000 mL | Freq: Once | INTRAVENOUS | Status: AC | PRN
  Administered 2023-12-24: 75 mL via INTRAVENOUS

## 2023-12-24 MED ORDER — MORPHINE SULFATE (PF) 4 MG/ML IV SOLN: 4.0000 mg | Freq: Once | INTRAVENOUS | Status: DC

## 2023-12-24 MED ORDER — METHOCARBAMOL 500 MG PO TABS
500.0000 mg | ORAL_TABLET | Freq: Two times a day (BID) | ORAL | 0 refills | Status: DC | PRN
Start: 2023-12-24 — End: 2024-01-15

## 2023-12-24 MED ORDER — NAPROXEN 500 MG PO TABS: 500.0000 mg | ORAL_TABLET | Freq: Two times a day (BID) | ORAL | 0 refills | Status: AC

## 2023-12-24 NOTE — ED Notes (Signed)
 CCMD Called

## 2023-12-24 NOTE — Discharge Instructions (Signed)
 All of your x-rays including the CT scan of your brain, cervical spine and chest abdomen and pelvis were reassuring showing no significant signs of trauma.  I suspect you have had a concussion  Read the attached instructions  Please take Naprosyn, 500mg  by mouth twice daily as needed for pain - this in an antiinflammatory medicine (NSAID) and is similar to ibuprofen - many people feel that it is stronger than ibuprofen and it is easier to take since it is a smaller pill.  Please use this only for 1 week - if your pain persists, you will need to follow up with your doctor in the office for ongoing guidance and pain control.  Please take Robaxin, 500 mg up to 2 or 3 times a day as needed for muscle spasm, this is a muscle relaxer, it may cause generalized weakness, sleepiness and you should not drive or do important things while taking this medication.  This includes driving a vehicle or taking care of young children, these things should not be done while taking this medication.  Thank you for allowing us  to treat you in the emergency department today.  After reviewing your examination and potential testing that was done it appears that you are safe to go home.  I would like for you to follow-up with your doctor within the next several days, have them obtain your records and follow-up with them to review all potential tests and results from your visit.  If you should develop severe or worsening symptoms return to the emergency department immediately
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
## 2023-12-24 NOTE — ED Notes (Signed)
 Pt in imaging

## 2023-12-24 NOTE — ED Notes (Signed)
 Xray at beside

## 2024-01-04 ENCOUNTER — Other Ambulatory Visit

## 2024-01-04 ENCOUNTER — Encounter

## 2024-01-12 ENCOUNTER — Other Ambulatory Visit: Payer: Self-pay | Admitting: Physician Assistant

## 2024-01-12 DIAGNOSIS — E782 Mixed hyperlipidemia: Secondary | ICD-10-CM

## 2024-01-14 ENCOUNTER — Ambulatory Visit: Payer: Self-pay
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
## 2024-01-15 ENCOUNTER — Encounter: Payer: Self-pay | Admitting: Physician Assistant

## 2024-01-15 ENCOUNTER — Ambulatory Visit: Admitting: Physician Assistant

## 2024-01-15 VITALS — BP 172/104 | HR 106 | Temp 98.6°F | Resp 18 | Ht 62.0 in | Wt 122.2 lb

## 2024-01-15 DIAGNOSIS — M62838 Other muscle spasm: Secondary | ICD-10-CM

## 2024-01-15 DIAGNOSIS — R5381 Other malaise: Secondary | ICD-10-CM

## 2024-01-15 DIAGNOSIS — I10 Essential (primary) hypertension: Secondary | ICD-10-CM

## 2024-01-15 DIAGNOSIS — R930 Abnormal findings on diagnostic imaging of skull and head, not elsewhere classified: Secondary | ICD-10-CM

## 2024-01-15 MED ORDER — METOPROLOL SUCCINATE ER 100 MG PO TB24: 100.0000 mg | ORAL_TABLET | Freq: Every day | ORAL | 2 refills | Status: DC

## 2024-01-15 MED ORDER — METHOCARBAMOL 500 MG PO TABS: 500.0000 mg | ORAL_TABLET | Freq: Two times a day (BID) | ORAL | 0 refills | Status: AC | PRN
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
## 2024-01-16 ENCOUNTER — Ambulatory Visit: Payer: Self-pay | Admitting: Physician Assistant

## 2024-01-16 LAB — COMPREHENSIVE METABOLIC PANEL WITH GFR
ALT: 13 IU/L (ref 0–32)
AST: 20 IU/L (ref 0–40)
Albumin: 4.5 g/dL (ref 3.9–4.9)
Alkaline Phosphatase: 128 IU/L (ref 49–135)
BUN/Creatinine Ratio: 19 (ref 12–28)
BUN: 13 mg/dL (ref 8–27)
Bilirubin Total: <0.2 mg/dL (ref 0.0–1.2)
CO2: 21 mmol/L (ref 20–29)
Calcium: 9.6 mg/dL (ref 8.7–10.3)
Chloride: 104 mmol/L (ref 96–106)
Creatinine, Ser: 0.69 mg/dL (ref 0.57–1.00)
Globulin, Total: 2.3 g/dL (ref 1.5–4.5)
Glucose: 101 mg/dL — ABNORMAL HIGH (ref 70–99)
Potassium: 3.5 mmol/L (ref 3.5–5.2)
Sodium: 140 mmol/L (ref 134–144)
Total Protein: 6.8 g/dL (ref 6.0–8.5)
eGFR: 97 mL/min/1.73 (ref >59)

## 2024-01-16 LAB — CBC WITH DIFFERENTIAL/PLATELET
Basophils Absolute: 0.1 x10E3/uL (ref 0.0–0.2)
Basos: 1 %
EOS (ABSOLUTE): 0.3 x10E3/uL (ref 0.0–0.4)
Eos: 4 %
Hematocrit: 40.4 % (ref 34.0–46.6)
Hemoglobin: 13.4 g/dL (ref 11.1–15.9)
Immature Grans (Abs): 0 x10E3/uL (ref 0.0–0.1)
Immature Granulocytes: 0 %
Lymphocytes Absolute: 2.4 x10E3/uL (ref 0.7–3.1)
Lymphs: 29 %
MCH: 31 pg (ref 26.6–33.0)
MCHC: 33.2 g/dL (ref 31.5–35.7)
MCV: 94 fL (ref 79–97)
Monocytes Absolute: 0.6 x10E3/uL (ref 0.1–0.9)
Monocytes: 8 %
Neutrophils Absolute: 4.7 x10E3/uL (ref 1.4–7.0)
Neutrophils: 58 %
Platelets: 370 x10E3/uL (ref 150–450)
RBC: 4.32 x10E6/uL (ref 3.77–5.28)
RDW: 13.9 % (ref 11.7–15.4)
WBC: 8.2 x10E3/uL (ref 3.4–10.8)

## 2024-01-16 LAB — TSH: TSH: 2.24 u[IU]/mL (ref 0.450–4.500)

## 2024-01-21 ENCOUNTER — Ambulatory Visit (INDEPENDENT_AMBULATORY_CARE_PROVIDER_SITE_OTHER): Admitting: Neurology

## 2024-01-21 ENCOUNTER — Encounter: Payer: Self-pay | Admitting: Neurology

## 2024-01-21 VITALS — BP 134/85 | HR 94 | Ht 61.0 in | Wt 123.2 lb

## 2024-01-21 DIAGNOSIS — I639 Cerebral infarction, unspecified: Secondary | ICD-10-CM

## 2024-01-21 DIAGNOSIS — E7849 Other hyperlipidemia: Secondary | ICD-10-CM | POA: Diagnosis not present

## 2024-01-21 DIAGNOSIS — Q2112 Patent foramen ovale: Secondary | ICD-10-CM

## 2024-01-21 NOTE — Patient Instructions (Signed)
 I had a long d/w patient about her abnormal CT scan and likely silent  stroke, risk for recurrent stroke/TIAs, personally independently reviewed imaging studies and stroke evaluation results and answered questions.Continue aspirin  81 mg daily  for secondary stroke prevention and maintain strict control of hypertension with blood pressure goal below 130/90, diabetes with hemoglobin A1c goal below 6.5% and lipids with LDL cholesterol goal below 70 mg/dL. I also advised the patient to eat a healthy diet with plenty of whole grains, cereals, fruits and vegetables, exercise regularly and maintain ideal body weight.  I recommend we check MRI scan of the brain with MR angiogram of the brain and neck, TCD bubble study and TEE for PFO.  Followup in the future with me in 6 to 8 months or call earlier if necessary.  Stroke Prevention Some medical conditions and behaviors can lead to a higher chance of having a stroke. You can help prevent a stroke by eating healthy, exercising, not smoking, and managing any medical conditions you have. Stroke is a leading cause of functional impairment. Primary prevention is particularly important because a majority of strokes are first-time events. Stroke changes the lives of not only those who experience a stroke but also their family and other caregivers. How can this condition affect me? A stroke is a medical emergency and should be treated right away. A stroke can lead to brain damage and can sometimes be life-threatening. If a person gets medical treatment right away, there is a better chance of surviving and recovering from a stroke. What can increase my risk? The following medical conditions may increase your risk of a stroke: Cardiovascular disease. High blood pressure (hypertension). Diabetes. High cholesterol. Sickle cell disease. Blood clotting disorders (hypercoagulable state). Obesity. Sleep disorders (obstructive sleep apnea). Other risk factors include: Being  older than age 60. Having a history of blood clots, stroke, or mini-stroke (transient ischemic attack, TIA). Genetic factors, such as race, ethnicity, or a family history of stroke. Smoking cigarettes or using other tobacco products. Taking birth control pills, especially if you also use tobacco. Heavy use of alcohol or drugs, especially cocaine and methamphetamine. Physical inactivity. What actions can I take to prevent this? Manage your health conditions High cholesterol levels. Eating a healthy diet is important for preventing high cholesterol. If cholesterol cannot be managed through diet alone, you may need to take medicines. Take any prescribed medicines to control your cholesterol as told by your health care provider. Hypertension. To reduce your risk of stroke, try to keep your blood pressure below 130/80. Eating a healthy diet and exercising regularly are important for controlling blood pressure. If these steps are not enough to manage your blood pressure, you may need to take medicines. Take any prescribed medicines to control hypertension as told by your health care provider. Ask your health care provider if you should monitor your blood pressure at home. Have your blood pressure checked every year, even if your blood pressure is normal. Blood pressure increases with age and some medical conditions. Diabetes. Eating a healthy diet and exercising regularly are important parts of managing your blood sugar (glucose). If your blood sugar cannot be managed through diet and exercise, you may need to take medicines. Take any prescribed medicines to control your diabetes as told by your health care provider. Get evaluated for obstructive sleep apnea. Talk to your health care provider about getting a sleep evaluation if you snore a lot or have excessive sleepiness. Make sure that any other medical conditions you  have, such as atrial fibrillation or atherosclerosis, are  managed. Nutrition Follow instructions from your health care provider about what to eat or drink to help manage your health condition. These instructions may include: Reducing your daily calorie intake. Limiting how much salt (sodium) you use to 1,500 milligrams (mg) each day. Using only healthy fats for cooking, such as olive oil, canola oil, or sunflower oil. Eating healthy foods. You can do this by: Choosing foods that are high in fiber, such as whole grains, and fresh fruits and vegetables. Eating at least 5 servings of fruits and vegetables a day. Try to fill one-half of your plate with fruits and vegetables at each meal. Choosing lean protein foods, such as lean cuts of meat, poultry without skin, fish, tofu, beans, and nuts. Eating low-fat dairy products. Avoiding foods that are high in sodium. This can help lower blood pressure. Avoiding foods that have saturated fat, trans fat, and cholesterol. This can help prevent high cholesterol. Avoiding processed and prepared foods. Counting your daily carbohydrate intake.  Lifestyle If you drink alcohol: Limit how much you have to: 0-1 drink a day for women who are not pregnant. 0-2 drinks a day for men. Know how much alcohol is in your drink. In the [place] one drink equals one 12 oz bottle of beer ( ), one 5 oz glass of wine ( ), or one 1 oz glass of hard liquor (44mL). Do not use any products that contain nicotine or tobacco. These products include cigarettes, chewing tobacco, and vaping devices, such as e-cigarettes. If you need help quitting, ask your health care provider. Avoid secondhand smoke. Do not use drugs. Activity  Try to stay at a healthy weight. Get at least 30 minutes of exercise on most days, such as: Fast walking. Biking. Swimming. Medicines Take over-the-counter and prescription medicines only as told by your health care provider. Aspirin  or blood thinners (antiplatelets or anticoagulants) may be recommended  to reduce your risk of forming blood clots that can lead to stroke. Avoid taking birth control pills. Talk to your health care provider about the risks of taking birth control pills if: You are over 62 years old. You smoke. You get very bad headaches. You have had a blood clot. Where to find more information American Stroke Association: www.strokeassociation.org Get help right away if: You or a loved one has any symptoms of a stroke. BE FAST is an easy way to remember the main warning signs of a stroke: B - Balance. Signs are dizziness, sudden trouble walking, or loss of balance. E - Eyes. Signs are trouble seeing or a sudden change in vision. F - Face. Signs are sudden weakness or numbness of the face, or the face or eyelid drooping on one side. A - Arms. Signs are weakness or numbness in an arm. This happens suddenly and usually on one side of the body. S - Speech. Signs are sudden trouble speaking, slurred speech, or trouble understanding what people say. T - Time. Time to call emergency services. Write down what time symptoms started. You or a loved one has other signs of a stroke, such as: A sudden, severe headache with no known cause. Nausea or vomiting. Seizure. These symptoms may represent a serious problem that is an emergency. Do not wait to see if the symptoms will go away. Get medical help right away. Call your local emergency services (911 in the [place] Do not drive yourself to the hospital. Summary You can help to prevent a stroke by eating  healthy, exercising, not smoking, limiting alcohol intake, and managing any medical conditions you may have. Do not use any products that contain nicotine or tobacco. These include cigarettes, chewing tobacco, and vaping devices, such as e-cigarettes. If you need help quitting, ask your health care provider. Remember BE FAST for warning signs of a stroke. Get help right away if you or a loved one has any of these signs. This information  is not intended to replace advice given to you by your health care provider. Make sure you discuss any questions you have with your health care provider. Document Revised: 01/30/2022 Document Reviewed: 01/30/2022 Elsevier Patient Education  2024 [employer].
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
## 2024-01-25 ENCOUNTER — Encounter

## 2024-01-25 ENCOUNTER — Other Ambulatory Visit

## 2024-01-29 ENCOUNTER — Other Ambulatory Visit: Payer: Self-pay | Admitting: Physician Assistant

## 2024-01-29 DIAGNOSIS — F411 Generalized anxiety disorder: Secondary | ICD-10-CM

## 2024-01-31 ENCOUNTER — Other Ambulatory Visit: Payer: Self-pay | Admitting: Physician Assistant

## 2024-01-31 ENCOUNTER — Ambulatory Visit (HOSPITAL_COMMUNITY)
Admission: RE | Admit: 2024-01-31 | Discharge: 2024-01-31 | Disposition: A | Discharge status: Home / Self Care | Source: Ambulatory Visit | Attending: Neurology | Admitting: Neurology

## 2024-01-31 DIAGNOSIS — I639 Cerebral infarction, unspecified: Secondary | ICD-10-CM | POA: Diagnosis present

## 2024-01-31 DIAGNOSIS — R002 Palpitations: Secondary | ICD-10-CM

## 2024-01-31 DIAGNOSIS — F321 Major depressive disorder, single episode, moderate: Secondary | ICD-10-CM

## 2024-01-31 DIAGNOSIS — I10 Essential (primary) hypertension: Secondary | ICD-10-CM

## 2024-01-31 MED ORDER — METOPROLOL SUCCINATE ER 100 MG PO TB24: 100.0000 mg | ORAL_TABLET | Freq: Every day | ORAL | 2 refills | Status: AC

## 2024-01-31 NOTE — Progress Notes (Signed)
 VASCULAR LAB    TCD bubble study has been performed.  See CV proc for preliminary results.   Kayron Hicklin, RVT 01/31/2024, 2:58 PM

## 2024-02-05 ENCOUNTER — Ambulatory Visit

## 2024-02-05 DIAGNOSIS — I639 Cerebral infarction, unspecified: Secondary | ICD-10-CM

## 2024-02-06 ENCOUNTER — Ambulatory Visit: Payer: Self-pay | Admitting: Neurology

## 2024-02-12 ENCOUNTER — Ambulatory Visit: Admitting: Physician Assistant

## 2024-02-12 ENCOUNTER — Ambulatory Visit

## 2024-02-12 ENCOUNTER — Encounter: Payer: Self-pay | Admitting: Physician Assistant

## 2024-02-12 VITALS — BP 152/92 | HR 91 | Temp 98.0°F | Ht 61.0 in | Wt 124.0 lb

## 2024-02-12 DIAGNOSIS — I639 Cerebral infarction, unspecified: Secondary | ICD-10-CM

## 2024-02-12 DIAGNOSIS — I10 Essential (primary) hypertension: Secondary | ICD-10-CM | POA: Diagnosis not present

## 2024-02-12 MED ORDER — GADOBENATE DIMEGLUMINE 529 MG/ML IV SOLN
20.0000 mL | Freq: Once | INTRAVENOUS | Status: AC | PRN
  Administered 2024-02-12: 20 mL via INTRAVENOUS

## 2024-02-12 MED ORDER — TRIAMTERENE-HCTZ 37.5-25 MG PO CAPS: 1.0000 | ORAL_CAPSULE | Freq: Every day | ORAL | 1 refills | Status: DC

## 2024-02-12 NOTE — Progress Notes (Signed)
 Subjective:  Patient ID: Kimberly David, female    DOB: 1960-06-10  Age: 63 y.o. MRN: [id number]  Chief Complaint  Patient presents with   Hypertension    HPI Pt in today for follow up of hypertension.  She is currently taking toprol  XL 100mg  qd for her blood pressure.  BP is elevated today initially at 152/92 She denies chest pain or dyspnea  Pt was recently seen and found to have evidence of silent cerebral infarction seen on head CT.  She has been referred to neurology for further evaluation and had a brain MRI and MRA which was done today.  She also had a TCD bubble test which was positive showing a large right to left shunting with valsalva and is being referred to cardiology for TTE and further evaluation for PFO - pt has not gotten those appts yet     02/12/2024    3:10 PM 11/14/2023    9:40 AM 05/09/2023    8:54 AM 11/06/2022    9:55 AM 02/14/2022    9:28 AM  Depression screen PHQ 2/9  Decreased Interest 1 1 0 0 0  Down, Depressed, Hopeless 2 2 0 0 1  PHQ - 2 Score 3 3 0 0 1  Altered sleeping 2 0 0 1 3  Tired, decreased energy 2 1 0 0 3  Change in appetite 2 0 0 0 0  Feeling bad or failure about yourself  2 0 0 0 0  Trouble concentrating 1 0 0 0 0  Moving slowly or fidgety/restless 2 0 0 0 0  Suicidal thoughts 1 0 0 0 0  PHQ-9 Score 15 4  0  1  7   Difficult doing work/chores Somewhat difficult Not difficult at all Not difficult at all Not difficult at all Somewhat difficult     Data saved with a previous flowsheet row definition        05/28/2020   12:00 AM 05/28/2020    8:50 AM 11/06/2022    9:54 AM 05/09/2023    8:54 AM 11/14/2023    9:40 AM  Fall Risk  Falls in the past year?   0 0 0  Was there an injury with Fall?   0  0  0   Fall Risk Category Calculator   0 0 0  (RETIRED) Patient Fall Risk Level Low fall risk  Low fall risk      Patient at Risk for Falls Due to   No Fall Risks No Fall Risks No Fall Risks  Fall risk Follow up   Falls evaluation completed  Falls evaluation completed Falls evaluation completed     Data saved with a previous flowsheet row definition     ROS CONSTITUTIONAL: Negative for chills, fatigue, fever, E/N/T: Negative for ear pain, nasal congestion and sore throat.  CARDIOVASCULAR: Negative for chest pain, dizziness, palpitations and pedal edema.  RESPIRATORY: Negative for recent cough and dyspnea.   PSYCHIATRIC: Negative for sleep disturbance and to question depression screen.  Negative for depression, negative for anhedonia.    Current Outpatient Medications:    albuterol  (VENTOLIN  HFA) 108 (90 Base) MCG/ACT inhaler, Inhale 1-2 puffs into the lungs every 4 (four) hours as needed for shortness of breath., Disp: , Rfl:    ALPRAZolam  (XANAX ) 0.25 MG tablet, TAKE 1 TABLET BY MOUTH EVERY DAY AT BEDTIME AS NEEDED, Disp: 30 tablet, Rfl: 0   aspirin  EC 81 MG tablet, Take 1 tablet (81 mg total) by mouth daily. Swallow  whole., Disp: 90 tablet, Rfl: 3   atorvastatin  (LIPITOR) 40 MG tablet, TAKE 1 TABLET BY MOUTH EVERY DAY, Disp: 90 tablet, Rfl: 1   FYAVOLV 1-5 MG-MCG TABS tablet, Take 1 tablet by mouth daily., Disp: , Rfl:    meclizine  (ANTIVERT ) 12.5 MG tablet, TAKE 1 TABLET (12.5 MG TOTAL) BY MOUTH 2 (TWO) TIMES DAILY AS NEEDED FOR DIZZINESS., Disp: 60 tablet, Rfl: 0   methocarbamol  (ROBAXIN ) 500 MG tablet, Take 1 tablet (500 mg total) by mouth 2 (two) times daily as needed for muscle spasms., Disp: 20 tablet, Rfl: 0   metoprolol  succinate (TOPROL  XL) 100 MG 24 hr tablet, Take 1 tablet (100 mg total) by mouth daily. Take with or immediately following a meal., Disp: 30 tablet, Rfl: 2   omeprazole  (PRILOSEC) 20 MG capsule, Take 1 capsule (20 mg total) by mouth daily., Disp: 90 capsule, Rfl: 1   triamterene-hydrochlorothiazide (DYAZIDE) 37.5-25 MG capsule, Take 1 each (1 capsule total) by mouth daily., Disp: 30 capsule, Rfl: 1   venlafaxine  XR (EFFEXOR -XR) 150 MG 24 hr capsule, TAKE 1 CAPSULE BY MOUTH DAILY WITH BREAKFAST., Disp:  90 capsule, Rfl: 0   naproxen  (NAPROSYN ) 500 MG tablet, Take 1 tablet (500 mg total) by mouth 2 (two) times daily with a meal. (Patient not taking: Reported on 02/12/2024), Disp: 30 tablet, Rfl: 0   nitroGLYCERIN  (NITROSTAT ) 0.4 MG SL tablet, Place 0.4 mg under the tongue every 5 (five) minutes as needed for chest pain. (Patient not taking: Reported on 02/12/2024), Disp: , Rfl:   Past Medical History:  Diagnosis Date   Abnormal blood chemistry 10/27/2019   Angina pectoris 01/30/2020   Anxiety    Cardiac murmur 05/09/2019   COVID-19    Depression    Depression, major, single episode, moderate (HCC) 05/06/2019   Easy bruising 10/27/2019   Eustachian tube disorder 12/23/2019   Ganglion cyst of wrist    Rt   History of severe acute respiratory syndrome coronavirus 2 (SARS-CoV-2) disease 03/02/2019   Hyperlipidemia    takes lipitor   Mixed hyperlipidemia 05/06/2019   Moderate recurrent major depression (HCC) 06/16/2019   Need for tetanus, diphtheria, and acellular pertussis (Tdap) vaccine 03/17/2020   Other fatigue 07/16/2019   Otitis media 12/23/2019   Palpitation 05/06/2019   Palpitations 05/09/2019   Objective:  PHYSICAL EXAM:   BP (!) 152/92 (BP Location: Left Arm, Patient Position: Sitting)   Pulse 91   Temp 98 F (36.7 C) (Temporal)   Ht 5' 1 (1.549 m)   Wt 124 lb (56.2 kg)   SpO2 99%   BMI 23.43 kg/m    GEN: Well nourished, well developed, in no acute distress  Cardiac: RRR; no murmurs, rubs, or gallops,no edema - Respiratory:  normal respiratory rate and pattern with no distress - normal breath sounds with no rales, rhonchi, wheezes or rubs Skin: warm and dry, no rash  Psych: euthymic mood, appropriate affect and demeanor  Assessment & Plan:    Silent cerebral infarction [facility]) Follow up with neurology and cardiology as directed Primary hypertension -     Triamterene-HCTZ; Take 1 each (1 capsule total) by mouth daily.  Dispense: 30 capsule; Refill: 1 Continue toprol  XL 100mg   qd     Follow-up: Return in about 3 weeks (around 03/04/2024) for nurse visit bp check.  An After Visit Summary was printed and given to the patient.  Kimberly JONELLE NICHOLAUS DEVONNA [facility] [phone]

## 2024-02-14 ENCOUNTER — Other Ambulatory Visit: Payer: Self-pay | Admitting: Neurology

## 2024-02-14 DIAGNOSIS — I639 Cerebral infarction, unspecified: Secondary | ICD-10-CM

## 2024-02-20 ENCOUNTER — Inpatient Hospital Stay: Admission: RE | Admit: 2024-02-20 | Source: Ambulatory Visit

## 2024-02-20 ENCOUNTER — Encounter

## 2024-03-04 ENCOUNTER — Ambulatory Visit

## 2024-03-04 NOTE — Progress Notes (Signed)
 Patient is in office today for a nurse visit for Blood Pressure Check. Patient blood pressure was 112/64, Patient No chest pain, No shortness of breath, No dyspnea on exertion, No orthopnea, No paroxysmal nocturnal dyspnea, No edema, No palpitations, No syncope.   Spoke with patient, verbalized understanding that blood pressure is good.   Patient also states that her left foot turns purple when she is standing, the left big toe is painful after standing for a while, pain scale 4/10. Patient states this has been going on for a while.   Spoke with PCP, she states she needs an appointment.  Patient is coming in January 5th at 1420 to see PCP.

## 2024-03-06 ENCOUNTER — Other Ambulatory Visit: Payer: Self-pay | Admitting: Physician Assistant

## 2024-03-06 DIAGNOSIS — I10 Essential (primary) hypertension: Secondary | ICD-10-CM

## 2024-03-17 ENCOUNTER — Ambulatory Visit: Admitting: Physician Assistant

## 2024-03-24 ENCOUNTER — Encounter: Payer: Self-pay | Admitting: Physician Assistant

## 2024-04-01 ENCOUNTER — Other Ambulatory Visit: Payer: Self-pay | Admitting: *Deleted

## 2024-04-01 ENCOUNTER — Ambulatory Visit: Attending: Cardiology | Admitting: Cardiology

## 2024-04-01 VITALS — BP 120/70 | HR 82 | Ht 61.5 in | Wt 123.0 lb

## 2024-04-01 DIAGNOSIS — Q2112 Patent foramen ovale: Secondary | ICD-10-CM

## 2024-04-01 DIAGNOSIS — I639 Cerebral infarction, unspecified: Secondary | ICD-10-CM | POA: Diagnosis not present

## 2024-04-01 DIAGNOSIS — Z01812 Encounter for preprocedural laboratory examination: Secondary | ICD-10-CM | POA: Diagnosis not present

## 2024-04-01 NOTE — Patient Instructions (Signed)
 Medication Instructions:  The current medical regimen is effective;  continue present plan and medications.  *If you need a refill on your cardiac medications before your next appointment, please call your pharmacy*  Lab Work: CBC, BMP  If you have labs (blood work) drawn today and your tests are completely normal, you will receive your results only by: MyChart Message (if you have MyChart) OR A paper copy in the mail If you have any lab test that is abnormal or we need to change your treatment, we will call you to review the results.  Testing/Procedures: Your physician has requested that you have a TEE. During a TEE, sound waves are used to create images of your heart. It provides your doctor with information about the size and shape of your heart and how well your hearts chambers and valves are working. In this test, a transducer is attached to the end of a flexible tube thats guided down your throat and into your esophagus (the tube leading from you mouth to your stomach) to get a more detailed image of your heart. You are not awake for the procedure. Please see the instruction sheet given to you today.       Dear Kimberly David  You are scheduled for a TEE (Transesophageal Echocardiogram) on Thursday, January 22 with Dr. Sheena.  Please arrive at the [facility] (Main Entrance A) at [facility]: [address] [place], [place] [address] at 9:30 AM (This time is 1 hour(s) before your procedure to ensure your preparation).   Free valet parking service is available. You will check in at ADMITTING.   *Please Note: You will receive a call the day before your procedure to confirm the appointment time. That time may have changed from the original time based on the schedule for that day.*    DIET:  Nothing to eat or drink after midnight except a sip of water with medications (see medication instructions below)  MEDICATION INSTRUCTIONS: !!IF ANY NEW MEDICATIONS ARE STARTED  AFTER TODAY, PLEASE NOTIFY YOUR PROVIDER AS SOON AS POSSIBLE!!  FYI: Medications such as Semaglutide (Ozempic, Wegovy), Tirzepatide (Mounjaro, Zepbound), Dulaglutide (Trulicity), etc (GLP1 agonists) AND Canagliflozin (Invokana), Dapagliflozin (Farxiga), Empagliflozin (Jardiance), Ertugliflozin (Steglatro), Bexagliflozin [employer]) or any combination with one of these drugs such as Invokamet (Canagliflozin/Metformin), Synjardy (Empagliflozin/Metformin), etc (SGLT2 inhibitors) must be held around the time of a procedure. This is not a comprehensive list of all of these drugs. Please review all of your medications and talk to your provider if you take any one of these. If you are not sure, ask your provider.   LABS: Please have blood work tomorrow at your closest [facility].  FYI:  For your safety, and to allow us  to monitor your vital signs accurately during the surgery/procedure we request: If you have artificial nails, gel coating, SNS etc, please have those removed prior to your surgery/procedure. Not having the nail coverings /polish removed may result in cancellation or delay of your surgery/procedure.  Your support person will be asked to wait in the waiting room during your procedure.  It is OK to have someone drop you off and come back when you are ready to be discharged.  You cannot drive after the procedure and will need someone to drive you home.  Bring your insurance cards.  *Special Note: Every effort is made to have your procedure done on time. Occasionally there are emergencies that occur at the hospital that may cause delays. Please be patient if a  delay does occur.    Follow-Up: At [facility], you and your health needs are our priority.  As part of our continuing mission to provide you with exceptional heart care, our providers are all part of one team.  This team includes your primary Cardiologist (physician) and Advanced Practice Providers or APPs (Physician Assistants  and Nurse Practitioners) who all work together to provide you with the care you need, when you need it.  Your next appointment:   Follow up will be based on the results of the above study.   We recommend signing up for the patient portal called MyChart.  Sign up information is provided on this After Visit Summary.  MyChart is used to connect with patients for Virtual Visits (Telemedicine).  Patients are able to view lab/test results, encounter notes, upcoming appointments, etc.  Non-urgent messages can be sent to your provider as well.   To learn more about what you can do with MyChart, go to [email or web address].
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
## 2024-04-02 ENCOUNTER — Telehealth: Payer: Self-pay | Admitting: *Deleted

## 2024-04-02 NOTE — Progress Notes (Signed)
 Pt called for pre procedure instructions.  Left message to call back for instructions Arrival time 0930 NPO after midnight explained Instructed to take am meds with sip of water and confirmed blood thinner consistency Instructed pt need for ride home tomorrow and have responsible adult with them for 24 hrs post procedure.

## 2024-04-02 NOTE — Telephone Encounter (Signed)
 Received message from precert that pt's insurance has not approved TEE scheduled for tomorrow.  Attempted to contact pt to cancel.  No answer.  Left message for pt to call back to be rescheduled.  Procedure cancelled with central scheduling.

## 2024-04-03 ENCOUNTER — Ambulatory Visit (HOSPITAL_COMMUNITY): Admission: RE | Admit: 2024-04-03 | Source: Home / Self Care | Admitting: Cardiology

## 2024-04-03 ENCOUNTER — Encounter (HOSPITAL_COMMUNITY): Admission: RE | Payer: Self-pay | Source: Home / Self Care

## 2024-04-03 SURGERY — TRANSESOPHAGEAL ECHOCARDIOGRAM (TEE) (CATHLAB)
Anesthesia: Monitor Anesthesia Care

## 2024-04-12 ENCOUNTER — Other Ambulatory Visit: Payer: Self-pay | Admitting: Physician Assistant

## 2024-04-12 DIAGNOSIS — K219 Gastro-esophageal reflux disease without esophagitis: Secondary | ICD-10-CM

## 2024-04-17 NOTE — Telephone Encounter (Signed)
 Left another message for pt to call back to reschedule TEE.  Also, will need to have CBC, BMP completed prior to procedure once scheduled.

## 2024-05-20 ENCOUNTER — Ambulatory Visit: Admitting: Physician Assistant

## 2024-11-27 ENCOUNTER — Ambulatory Visit: Admitting: Neurology
